# Patient Record
Sex: Male | Born: 1955
Health system: Southern US, Community
[De-identification: ages and names within clinical notes are randomized; demographics above are authoritative.]

## PROBLEM LIST (undated history)

## (undated) DIAGNOSIS — R519 Headache, unspecified: Secondary | ICD-10-CM

## (undated) DIAGNOSIS — I10 Essential (primary) hypertension: Secondary | ICD-10-CM

## (undated) DIAGNOSIS — R590 Localized enlarged lymph nodes: Secondary | ICD-10-CM

## (undated) DIAGNOSIS — C8590 Non-Hodgkin lymphoma, unspecified, unspecified site: Secondary | ICD-10-CM

## (undated) DIAGNOSIS — J45909 Unspecified asthma, uncomplicated: Secondary | ICD-10-CM

## (undated) DIAGNOSIS — R011 Cardiac murmur, unspecified: Secondary | ICD-10-CM

## (undated) DIAGNOSIS — G4700 Insomnia, unspecified: Secondary | ICD-10-CM

## (undated) DIAGNOSIS — K635 Polyp of colon: Secondary | ICD-10-CM

## (undated) DIAGNOSIS — C439 Malignant melanoma of skin, unspecified: Secondary | ICD-10-CM

## (undated) DIAGNOSIS — K5790 Diverticulosis of intestine, part unspecified, without perforation or abscess without bleeding: Secondary | ICD-10-CM

## (undated) HISTORY — DX: Malignant melanoma of skin, unspecified: C43.9

## (undated) HISTORY — DX: Polyp of colon: K63.5

## (undated) HISTORY — PX: KNEE ARTHROSCOPY: SHX127

## (undated) HISTORY — PX: COLONOSCOPY: SHX174

## (undated) HISTORY — DX: Diverticulosis of intestine, part unspecified, without perforation or abscess without bleeding: K57.90

## (undated) HISTORY — DX: Insomnia, unspecified: G47.00

## (undated) HISTORY — PX: POLYPECTOMY: SHX149

## (undated) HISTORY — DX: Non-Hodgkin lymphoma, unspecified, unspecified site: C85.90

## (undated) HISTORY — DX: Unspecified asthma, uncomplicated: J45.909

## (undated) HISTORY — DX: Headache, unspecified: R51.9

## (undated) NOTE — *Deleted (*Deleted)
HEMATOLOGY/ONCOLOGY CONSULTATION NOTE  Date of Service: 12/05/2019  Patient Care Team: Geoffry Paradise, MD as PCP - General (Internal Medicine)  CHIEF COMPLAINTS/PURPOSE OF CONSULTATION:  Lymphoma  HISTORY OF PRESENTING ILLNESS:  Mike Jenkins is a wonderful 36 y.o. male who has been referred to [place] by Dr. Geoffry Paradise for evaluation and management of lymphoma. The pt reports that he is doing well overall.   The pt reports that he developed rt tonsillar swelling a few months ago which did not respond to antibiotics and he was sent to ENT. He had a CT neck which showed- CT Soft Tissue Neck ([id number]) completed on 10/06/2019 with results revealing "Asymmetric fullness of the right tonsil. Although no masslike enhancement or submucosal tumor, recommend continued workup/biopsy given there is also a right level 2 lymphadenopathy."   Patient noted a lump in the rt groin and had an MRI Abd/Pel ([id number]) ([id number]) completed on 09/22/2019 with results revealing "1. A right inguinal lymph node is pathologically enlarged with short axis diameter of 1.8 cm and diffuse enhancement. Possibilities may include reactive adenopathy or malignant adenopathy, for example in the setting of lymphoma. Tenderness associated with such lymph nodes is often considered in it indicator of a benign reactive etiology although this is not absolute, and if the lymph node fails to resolve spontaneously, biopsy may be warranted. 2. Chronic stranding in the central upper abdominal mesentery with scattered small mesenteric lymph nodes. This is unchanged from 2015 and has a pattern favoring chronic sclerosing mesenteritis although a similar pattern can occasionally be encountered in the setting of lymphoma. 3. Small right adrenal adenoma. 4. Bosniak category IIF cystic lesion of the left kidney lower pole. This complex cyst has some internal septations and is probably benign but warrants surveillance. 5. Benign  prostatic hypertrophy. 6. Suspected small gallstones. 7.  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  On review of systems, pt reports no fevers/chi//snight sweat or significant weight loss .  INTERVAL HISTORY: Mike Jenkins is a wonderful 59 y.o. male who is here for evaluation and management of lymphoma. The patient's last visit with [place] was on 11/17/2019. The pt reports that he is doing well overall.  The pt reports ***  Of note since the patient's last visit, pt has had Bone Marrow Report ([id number]) completed on 11/26/2019 with results revealing "BONE MARROW, ASPIRATE, CLOT, CORE: - Normocellular marrow with trilineage hematopoiesis - No morphologic or immunophenotypic evidence of lymphoma PERIPHERAL BLOOD: - Mild normocytic anemia."  Lab results today (12/05/19) of CBC w/diff and CMP is as follows: all values are WNL except for ***.  On review of systems, pt reports *** and denies ***and any other symptoms.   A&P: -Discussed pt labwork today, 12/05/19; *** -Discussed 11/17/2019 Hep C & Hep B testing is negative, LDH is WNL -***  MEDICAL HISTORY:  Past Medical History:  Diagnosis Date  . Asthma   . Colon polyps    hyperplastic  . Diverticulosis   . Heart murmur    since he was a baby-echo done preop hip surg 2003-  . Hypertension   . Lymphadenopathy, inguinal    right  . Melanoma (HCC)  SURGICAL HISTORY: Past Surgical History:  Procedure Laterality Date  . COLONOSCOPY    . I & D EXTREMITY  12/26/2011   Procedure: IRRIGATION AND DEBRIDEMENT EXTREMITY;  Surgeon: Tami Ribas, MD;  Location: [facility];  Service: Orthopedics;  Laterality: Left;  . KNEE ARTHROSCOPY Bilateral 1995, 1998   right and left  . MASS EXCISION  12/26/2011   Procedure: EXCISION MASS;  Surgeon: Tami Ribas, MD;  Location: [facility];  Service: Orthopedics;  Laterality: Left;  LEFT SMALL EXCISION MASS & DEBRIDEMENT DIP JOINT  . MELANOMA EXCISION Left 2013  . POLYPECTOMY    . tonsillar cauterization  11/09/2019   Tonsillar Cauterization (N/A Throat)  . TONSILLECTOMY Right 11/04/2019   Procedure: TONSILLECTOMY;  Surgeon: Osborn Coho, MD;  Location: [facility];  Service: ENT;  Laterality: Right;  . TONSILLECTOMY N/A 11/09/2019   Procedure: Tonsillar Cauterization;  Surgeon: Osborn Coho, MD;  Location: [facility];  Service: ENT;  Laterality: N/A;  . TOTAL HIP ARTHROPLASTY Left 2003    SOCIAL HISTORY: Social History   Socioeconomic History  . Marital status: Married    Spouse name: Not on file  . Number of children: 3  . Years of education: Not on file  . Highest education level: Not on file  Occupational History  . Occupation: Data processing manager: [employer]  Tobacco Use  . Smoking status: Former Games developer  . Smokeless tobacco: Never Used  Vaping Use  . Vaping Use: Never used  Substance and Sexual Activity  . Alcohol use: Yes    Comment: 2-3 drinks per week  . Drug use: No  . Sexual activity: Not on file  Other Topics Concern  . Not on file  Social History Narrative  . Not on file   Social Determinants of Health   Financial Resource Strain:   . Difficulty of Paying Living Expenses: Not on file  Food Insecurity:   . Worried About Programme researcher, broadcasting/film/video in the Last Year: Not on file  . Ran Out of Food in the Last Year: Not on file  Transportation Needs:   . Lack of Transportation (Medical): Not on file  . Lack of Transportation (Non-Medical): Not on file  Physical Activity:   . Days of Exercise per Week: Not on file  . Minutes of Exercise per  Session: Not on file  Stress:   . Feeling of Stress : Not on file  Social Connections:   . Frequency of Communication with Friends and Family: Not on file  . Frequency of Social Gatherings with Friends and Family: Not on file  . Attends Religious Services: Not on file  . Active Member of Clubs or Organizations: Not on file  . Attends Banker Meetings: Not on file  . Marital Status: Not on file  Intimate Partner Violence:   . Fear of Current or Ex-Partner: Not on file  . Emotionally Abused: Not on file  . Physically Abused: Not on file  . Sexually Abused: Not on file    FAMILY HISTORY: Family History  Problem Relation Age of Onset  . Colon polyps Father   . Colon cancer Neg Hx   . Esophageal cancer Neg Hx   . Rectal cancer Neg Hx   . Stomach cancer Neg Hx     ALLERGIES:  has No Known Allergies.  MEDICATIONS:  Current Outpatient Medications  Medication Sig Dispense Refill  . LORazepam (ATIVAN) 1 MG tablet Take  1 tablet by mouth at bedtime as needed.    Marland Kitchen losartan (COZAAR) 50 MG tablet Take 50 mg by mouth daily. Taking 2 tablets daily for total of 100 mg.     No current facility-administered medications for this visit.    REVIEW OF SYSTEMS:   A 10+ POINT REVIEW OF SYSTEMS WAS OBTAINED including neurology, dermatology, psychiatry, cardiac, respiratory, lymph, extremities, GI, GU, Musculoskeletal, constitutional, breasts, reproductive, HEENT.  All pertinent positives are noted in the HPI.  All others are negative.   PHYSICAL EXAMINATION: ECOG PERFORMANCE STATUS: 1 - Symptomatic but completely ambulatory  . There were no vitals filed for this visit. There were no vitals filed for this visit. .There is no height or weight on file to calculate BMI.  *** GENERAL:alert, in no acute distress and comfortable SKIN: no acute rashes, no significant lesions EYES: conjunctiva are pink and non-injected, sclera anicteric OROPHARYNX: MMM, no exudates, no oropharyngeal  erythema or ulceration NECK: supple, no JVD LYMPH:  no palpable lymphadenopathy in the cervical, axillary or inguinal regions LUNGS: clear to auscultation b/l with normal respiratory effort HEART: regular rate & rhythm ABDOMEN:  normoactive bowel sounds , non tender, not distended. No palpable hepatosplenomegaly.  Extremity: no pedal edema PSYCH: alert & oriented x 3 with fluent speech NEURO: no focal motor/sensory deficits  LABORATORY DATA:  I have reviewed the data as listed  . CBC Latest Ref Rng & Units 11/26/2019 11/17/2019 11/09/2019  WBC 4.0 - 10.5 K/uL 6.0 6.9 10.9(H)  Hemoglobin 13.0 - 17.0 g/dL 12.1(L) 10.7(L) 11.1(L)  Hematocrit 39 - 52 % 36.2(L) 31.5(L) 33.4(L)  Platelets 150 - 400 K/uL 209 235 170    . CMP Latest Ref Rng & Units 11/17/2019 11/09/2019 11/08/2019  Glucose 70 - 99 mg/dL 161(W) 960(A) 540(J)  BUN 8 - 23 mg/dL 13 81(X) 13  Creatinine 0.61 - 1.24 mg/dL 9.14 7.82 9.56  Sodium 135 - 145 mmol/L 142 136 139  Potassium 3.5 - 5.1 mmol/L 3.9 4.2 4.1  Chloride 98 - 111 mmol/L 106 100 102  CO2 22 - 32 mmol/L 29 - 27  Calcium 8.9 - 10.3 mg/dL 9.7 - 9.5  Total Protein 6.5 - 8.1 g/dL 7.1 - 6.8  Total Bilirubin 0.3 - 1.2 mg/dL 0.5 - 0.9  Alkaline Phos 38 - 126 U/L 54 - 51  AST 15 - 41 U/L 13(L) - 31  ALT 0 - 44 U/L 17 - 28   11/04/2019 Right Tonsillectomy Surgical Pathology Report [id number]):  Surgical Pathology Report Clinical History: None provided FINAL MICROSCOPIC DIAGNOSIS: A. TONSIL, RIGHT, TONSILLECTOMY: -Non-Hodgkin B-cell lymphoma -See comment COMMENT: The sections show effacement of the tonsillar tissue by a dense lymphoid proliferation characterized by numerous ill-defined atypical lymphoid follicles displaying attenuated or absent mantle zones, lack of polarity and a homogenous composition of lymphocytes. The follicles appear to coalesce in many areas forming a diffuse pattern of involvement. In both the follicular and diffuse areas, a similar  composition of lymphocytes is generally seen characterized by predominance of small round to angulated lymphocytes with high nuclear cytoplasmic ratio, dense chromatin and small to inconspicuous nucleoli admixed with larger centrocytes and centroblastic lymphoid cells to a lesser extent. The number of the latter varies but appears to average slightly less than 15 cells/HPF. Scattered residual squamous epithelial clusters are seen. A portion of the tonsil appears relatively uninvolved. Flow cytometric analysis was performed [facility] [phone]) but was essentially non-contributory. A battery of immunohistochemical stains was performed with appropriate controls and show that there  is a significant B-cell component in the follicular and diffuse components as seen with B-cell markers CD20 and CD79a associated with CD10, BCL6 and BCL2 expression. This is admixed with relative abundance of T cells as seen with CD3, CD5 and CD43. There is no apparent co-expression of CD5 in B-cell areas and no significant cyclin D1 positivity is identified. CD21 highlights dendritic networks in previously described follicles. CD138 highlights a relatively minor plasma cell component which shows polyclonal staining pattern for kappa and lambda light chains. The overall features are atypical and most consistent with involvement by non-Hodgkin B-cell lymphoma, follicular type with follicular and diffuse pattern. While grading of this process is somewhat challenging and the appearance is borderline, a low-grade process is slightly favored at this time (grade 2/3).   RADIOGRAPHIC STUDIES: I have personally reviewed the radiological images as listed and agreed with the findings in the report. 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 EXAM: CT GUIDED BONE MARROW ASPIRATION AND BIOPSY ANESTHESIA/SEDATION: Versed 4.0 mg IV, Fentanyl 100  mcg IV Total Moderate Sedation Time:   13 minutes. The patient's level of consciousness and physiologic status were continuously monitored during the procedure by Radiology nursing. PROCEDURE: The procedure risks, benefits, and alternatives were explained to the patient. Questions regarding the procedure were encouraged and answered. The patient understands and consents to the procedure. A time out was performed prior to initiating the procedure. The right gluteal region was prepped with chlorhexidine. Sterile gown and sterile gloves were used for the procedure. Local anesthesia was provided with 1% Lidocaine. Under CT guidance, an 11 gauge On Control bone cutting needle was advanced from a posterior approach into the right iliac bone. Needle positioning was confirmed with CT. Initial non heparinized and heparinized aspirate samples were obtained of bone marrow. Core biopsy was performed via the On Control drill needle. COMPLICATIONS: None FINDINGS: Inspection of initial aspirate did reveal visible particles. Intact core biopsy sample was obtained. IMPRESSION: CT guided bone marrow biopsy of right posterior iliac bone with both aspirate and core samples obtained. Electronically Signed   By: Irish Lack M.D.   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 EXAM: CT GUIDED BONE MARROW ASPIRATION AND BIOPSY ANESTHESIA/SEDATION: Versed 4.0 mg IV, Fentanyl 100 mcg IV Total Moderate Sedation Time:   13 minutes. The patient's level of consciousness and physiologic status were continuously monitored during the procedure by Radiology nursing. PROCEDURE: The procedure risks, benefits, and alternatives were explained to the patient. Questions regarding the procedure were encouraged and answered. The patient understands and consents to the procedure. A time out was performed prior  to initiating the procedure. The right gluteal region was prepped with chlorhexidine. Sterile gown and sterile gloves were used for the procedure. Local anesthesia was provided with 1% Lidocaine. Under CT guidance, an 11 gauge On Control bone cutting needle was advanced from a posterior approach into the right iliac bone. Needle positioning was confirmed with CT. Initial non heparinized and heparinized aspirate samples were obtained of bone marrow. Core biopsy was performed via the On Control drill needle. COMPLICATIONS: None FINDINGS: Inspection of initial aspirate did reveal visible particles. Intact core biopsy sample was obtained. IMPRESSION: CT guided bone marrow biopsy of right posterior iliac bone with both aspirate and core samples obtained. Electronically  Signed   By: Irish Lack M.D.   On: 11/26/2019 10:26     CT neck 8/26: IMPRESSION: Asymmetric fullness of the right tonsil. Although no masslike enhancement or submucosal tumor, recommend continued workup/biopsy given there is also a right level 2 lymphadenopathy.   Electronically Signed   By: Marnee Spring M.D.   On: 10/07/2019 07:28  MRI bad/pelvis IMPRESSION: 1. A right inguinal lymph node is pathologically enlarged with short axis diameter of 1.8 cm and diffuse enhancement. Possibilities may include reactive adenopathy or malignant adenopathy, for example in the setting of lymphoma. Tenderness associated with such lymph nodes is often considered in it indicator of a benign reactive etiology although this is not absolute, and if the lymph node fails to resolve spontaneously, biopsy may be warranted. 2. Chronic stranding in the central upper abdominal mesentery with scattered small mesenteric lymph nodes. This is unchanged from 2015 and has a pattern favoring chronic sclerosing mesenteritis although a similar pattern can occasionally be encountered in the setting of lymphoma. 3. Small right adrenal adenoma. 4. Bosniak  category IIF cystic lesion of the left kidney lower pole. This complex cyst has some internal septations and is probably benign but warrants surveillance. Follow up renal protocol MRI is recommended in 6 months time. This recommendation follows ACR consensus guidelines: Management of the Incidental Renal Mass on CT: A White Paper of the ACR Incidental Findings Committee. J Am Coll Radiol [phone]. 5. Benign prostatic hypertrophy. 6. Suspected small gallstones. 7.  Aortic Atherosclerosis (ICD10-I70.0).   Electronically Signed   By: Gaylyn Rong M.D.   On: 09/22/2019 17:02  ASSESSMENT & PLAN:   13 yo with   1) Newly diagnosed at least stage III Follicular likely low grade but grading difficult to determine from tonsillar sample 2) Inguiinal LNadenopathy- likely related to lymphoma  PLAN: ***   FOLLOW UP: ***   The total time spent in the appt was *** minutes and more than 50% was on counseling and direct patient cares.  All of the patient's questions were answered with apparent satisfaction. The patient knows to call the clinic with any problems, questions or concerns.    Wyvonnia Lora MD MS AAHIVMS [facility] Hematology/Oncology Physician [facility]  (Office):       [phone] (Work cell):  [phone] (Fax):           [phone]  12/05/2019 8:50 PM  I, Carollee Herter, am acting as a scribe for Dr. Wyvonnia Lora.   {Add Production assistant, radio Statement}

---

## 2001-02-11 HISTORY — PX: TOTAL HIP ARTHROPLASTY: SHX124

## 2002-09-03 ENCOUNTER — Emergency Department (HOSPITAL_COMMUNITY): Admission: EM | Admit: 2002-09-03 | Discharge: 2002-09-03 | Payer: Self-pay | Admitting: Emergency Medicine

## 2002-09-03 ENCOUNTER — Encounter: Payer: Self-pay | Admitting: Emergency Medicine

## 2007-01-21 ENCOUNTER — Ambulatory Visit: Payer: Self-pay | Admitting: Internal Medicine

## 2007-03-09 ENCOUNTER — Ambulatory Visit: Payer: Self-pay | Admitting: Internal Medicine

## 2007-03-09 ENCOUNTER — Encounter: Payer: Self-pay | Admitting: Internal Medicine

## 2008-10-15 ENCOUNTER — Emergency Department (HOSPITAL_COMMUNITY): Admission: EM | Admit: 2008-10-15 | Discharge: 2008-10-15 | Payer: Self-pay | Admitting: Emergency Medicine

## 2010-09-10 ENCOUNTER — Other Ambulatory Visit: Payer: Self-pay | Admitting: Family Medicine

## 2010-09-10 ENCOUNTER — Ambulatory Visit
Admission: RE | Admit: 2010-09-10 | Discharge: 2010-09-10 | Disposition: A | Discharge status: Home / Self Care | Payer: BC Managed Care – PPO | Source: Ambulatory Visit | Attending: Family Medicine | Admitting: Family Medicine

## 2010-09-10 DIAGNOSIS — S301XXA Contusion of abdominal wall, initial encounter: Secondary | ICD-10-CM

## 2010-09-10 DIAGNOSIS — R52 Pain, unspecified: Secondary | ICD-10-CM

## 2010-09-10 MED ORDER — IOHEXOL 300 MG/ML  SOLN
100.0000 mL | Freq: Once | INTRAMUSCULAR | Status: AC | PRN
  Administered 2010-09-10: 100 mL via INTRAVENOUS

## 2010-09-28 ENCOUNTER — Other Ambulatory Visit (HOSPITAL_COMMUNITY): Payer: Self-pay | Admitting: Internal Medicine

## 2010-09-28 DIAGNOSIS — S301XXA Contusion of abdominal wall, initial encounter: Secondary | ICD-10-CM

## 2010-10-02 ENCOUNTER — Inpatient Hospital Stay (HOSPITAL_COMMUNITY): Admission: RE | Admit: 2010-10-02 | Payer: BC Managed Care – PPO | Source: Ambulatory Visit

## 2010-10-02 ENCOUNTER — Encounter (HOSPITAL_COMMUNITY): Payer: BC Managed Care – PPO

## 2010-10-08 ENCOUNTER — Encounter (HOSPITAL_COMMUNITY): Payer: BC Managed Care – PPO

## 2010-10-08 ENCOUNTER — Ambulatory Visit (HOSPITAL_COMMUNITY): Payer: BC Managed Care – PPO

## 2010-10-13 ENCOUNTER — Inpatient Hospital Stay (INDEPENDENT_AMBULATORY_CARE_PROVIDER_SITE_OTHER)
Admission: RE | Admit: 2010-10-13 | Discharge: 2010-10-13 | Disposition: A | Discharge status: Home / Self Care | Payer: BC Managed Care – PPO | Source: Ambulatory Visit | Attending: Emergency Medicine | Admitting: Emergency Medicine

## 2010-10-13 DIAGNOSIS — B86 Scabies: Secondary | ICD-10-CM

## 2011-02-12 HISTORY — PX: MELANOMA EXCISION: SHX5266

## 2011-11-11 ENCOUNTER — Emergency Department (INDEPENDENT_AMBULATORY_CARE_PROVIDER_SITE_OTHER)
Admission: EM | Admit: 2011-11-11 | Discharge: 2011-11-11 | Disposition: A | Discharge status: Home / Self Care | Payer: BC Managed Care – PPO | Source: Home / Self Care

## 2011-11-11 ENCOUNTER — Encounter (HOSPITAL_COMMUNITY): Payer: Self-pay

## 2011-11-11 DIAGNOSIS — L03012 Cellulitis of left finger: Secondary | ICD-10-CM

## 2011-11-11 DIAGNOSIS — L02519 Cutaneous abscess of unspecified hand: Secondary | ICD-10-CM

## 2011-11-11 MED ORDER — CEPHALEXIN 500 MG PO CAPS: 500.0000 mg | ORAL_CAPSULE | Freq: Four times a day (QID) | ORAL | Status: DC

## 2011-11-11 NOTE — ED Notes (Signed)
Patient complains of left "pinky" finger soreness, states has been there 2 weeks, states his PCP Jacky Kindle prescribed a antibiotic for him but he misread the instructions and has only taken it once a day vs 4x daily, patient does not know the name of antibiotic prescribed

## 2011-11-11 NOTE — ED Provider Notes (Signed)
History     CSN: [id number]  Arrival date & time 11/11/11  0915   None     Chief Complaint  Patient presents with  . Finger Injury    (Consider location/radiation/quality/duration/timing/severity/associated sxs/prior treatment) HPI Comments: 56-year-old male who states he had an infection on his left hand fifth digit for approximately one month. Isn't certain of the nature of the injury. There was a mounded area of swelling on the dorsal aspect of the distal phalanx. He had popped it and it had drained some pus over the past 2-3 weeks. He called his doctor and prescription antibiotic was called and all the physician did not evaluate it. He describes With Sounds like Keflex. We Got the Prescription Filled He Only Took One a Day Not Reading the Instructions of 4 Times a Day. Since There Was 4 Times a Day about 5 Days Ago He Is Still Only Taken about 2 Days Worth of Antibiotics 4 Times a Day the Other Days Have Been Quite Sporadic and self dosing. He complains of mild discomfort where the small raised lesion is and is concerned there may be a foreign body but is not certain.   History reviewed. No pertinent past medical history.  Past Surgical History  Procedure Date  . Total hip arthroplasty LEFT    No family history on file.  History  Substance Use Topics  . Smoking status: Never Smoker   . Smokeless tobacco: Not on file  . Alcohol Use: Yes      Review of Systems  Constitutional: Negative.   Respiratory: Negative.   Gastrointestinal: Negative.   Genitourinary: Negative.   Musculoskeletal: Negative for joint swelling and arthralgias.       As per HPI  Skin: Negative.   Neurological: Negative for dizziness, weakness, numbness and headaches.    Allergies  Review of patient's allergies indicates no known allergies.  Home Medications   Current Outpatient Rx  Name Route Sig Dispense Refill  . ALTACE PO Oral Take by mouth daily.    . CEPHALEXIN 500 MG PO CAPS Oral Take  1 capsule (500 mg total) by mouth 4 (four) times daily. 20 capsule 0    BP 115/65  Pulse 71  Temp 98.2 F (36.8 C) (Oral)  Resp 16  SpO2 98%  Physical Exam  Constitutional: He is oriented to person, place, and time. He appears well-developed and well-nourished.  HENT:  Head: Normocephalic and atraumatic.  Eyes: EOM are normal. Left eye exhibits no discharge.  Neck: Normal range of motion. Neck supple.  Neurological: He is alert and oriented to person, place, and time. No cranial nerve deficit.  Skin: Skin is warm and dry.       The lesion on the left fifth finger is located on the extensor surface of the distal phalanx. He has a annular slightly raised lesion with mild erythema. The erythema does not extend beyond the lesion. He does complain of soreness in the DI P. no deformity or swelling.  Psychiatric: He has a normal mood and affect.    ED Course  Procedures (including critical care time)  Labs Reviewed - No data to display No results found.   1. Cellulitis of fifth finger of left hand       MDM  Keflex 500 4 times a day for the next 5 days. This will give him enough for another 8 days of treatment at a 4 times a day schedule. He is to soak his right little finger in warm  saltwater 3-4 times a day if possible. If in 2-3 days he is not seeing improvement he is to call his physician to get a referral to a hand surgeon. He initially stated that he wanted to give the antibiotics a chance to work. We discussed the potential to develop tenosynovitis as well as the possibility of a foreign body which would require the services of a hand surgeon. At this point the evaluation does not require a hand surgeon as long as antibiotics are resolving the lesion. But if this is not the case he will need to see a hand surgeon soon. He knows not to wait if there is any worsening. Is also advised that if he is unable to have his physician make a referral to a hand surgeon if not improving and he  may call back to the urgent care and we can make a referral for him.        Hayden Rasmussen, NP 11/11/11 1135  Hayden Rasmussen, NP 11/11/11 1136

## 2011-11-13 NOTE — ED Provider Notes (Signed)
Medical screening examination/treatment/procedure(s) were performed by non-physician practitioner and as supervising physician I was immediately available for consultation/collaboration.  Leslee Home, M.D.   Reuben Likes, MD 11/13/11 [phone]

## 2011-12-06 ENCOUNTER — Encounter (HOSPITAL_BASED_OUTPATIENT_CLINIC_OR_DEPARTMENT_OTHER): Payer: Self-pay | Admitting: *Deleted

## 2011-12-06 ENCOUNTER — Other Ambulatory Visit: Payer: Self-pay | Admitting: Orthopedic Surgery

## 2011-12-06 NOTE — Progress Notes (Signed)
Will need istat and ekg 

## 2011-12-09 ENCOUNTER — Encounter (HOSPITAL_BASED_OUTPATIENT_CLINIC_OR_DEPARTMENT_OTHER): Payer: Self-pay | Admitting: Anesthesiology

## 2011-12-09 ENCOUNTER — Ambulatory Visit (HOSPITAL_BASED_OUTPATIENT_CLINIC_OR_DEPARTMENT_OTHER)
Admission: RE | Admit: 2011-12-09 | Payer: BC Managed Care – PPO | Source: Ambulatory Visit | Admitting: Orthopedic Surgery

## 2011-12-09 ENCOUNTER — Encounter (HOSPITAL_BASED_OUTPATIENT_CLINIC_OR_DEPARTMENT_OTHER): Admission: RE | Payer: Self-pay | Source: Ambulatory Visit

## 2011-12-09 HISTORY — DX: Cardiac murmur, unspecified: R01.1

## 2011-12-09 HISTORY — DX: Essential (primary) hypertension: I10

## 2011-12-09 SURGERY — EXCISION MASS
Anesthesia: Choice | Site: Finger | Laterality: Left

## 2011-12-25 ENCOUNTER — Other Ambulatory Visit: Payer: Self-pay

## 2011-12-25 ENCOUNTER — Encounter (HOSPITAL_BASED_OUTPATIENT_CLINIC_OR_DEPARTMENT_OTHER)
Admission: RE | Admit: 2011-12-25 | Discharge: 2011-12-25 | Disposition: A | Discharge status: Home / Self Care | Payer: BC Managed Care – PPO | Source: Ambulatory Visit | Attending: Orthopedic Surgery | Admitting: Orthopedic Surgery

## 2011-12-25 LAB — BASIC METABOLIC PANEL
BUN: 17 mg/dL (ref 6–23)
CO2: 28 mEq/L (ref 19–32)
Calcium: 9.4 mg/dL (ref 8.4–10.5)
GFR calc non Af Amer: 82 mL/min — ABNORMAL LOW (ref >90)
Glucose, Bld: 117 mg/dL — ABNORMAL HIGH (ref 70–99)

## 2011-12-26 ENCOUNTER — Encounter (HOSPITAL_BASED_OUTPATIENT_CLINIC_OR_DEPARTMENT_OTHER): Payer: Self-pay | Admitting: Anesthesiology

## 2011-12-26 ENCOUNTER — Encounter (HOSPITAL_BASED_OUTPATIENT_CLINIC_OR_DEPARTMENT_OTHER)
Admission: RE | Disposition: A | Discharge status: Home / Self Care | Payer: Self-pay | Source: Ambulatory Visit | Attending: Orthopedic Surgery

## 2011-12-26 ENCOUNTER — Ambulatory Visit (HOSPITAL_BASED_OUTPATIENT_CLINIC_OR_DEPARTMENT_OTHER)
Admission: RE | Admit: 2011-12-26 | Discharge: 2011-12-26 | Disposition: A | Discharge status: Home / Self Care | Payer: BC Managed Care – PPO | Source: Ambulatory Visit | Attending: Orthopedic Surgery | Admitting: Orthopedic Surgery

## 2011-12-26 ENCOUNTER — Ambulatory Visit (HOSPITAL_BASED_OUTPATIENT_CLINIC_OR_DEPARTMENT_OTHER): Payer: BC Managed Care – PPO | Admitting: Anesthesiology

## 2011-12-26 ENCOUNTER — Encounter (HOSPITAL_BASED_OUTPATIENT_CLINIC_OR_DEPARTMENT_OTHER): Payer: Self-pay | Admitting: *Deleted

## 2011-12-26 DIAGNOSIS — M898X9 Other specified disorders of bone, unspecified site: Secondary | ICD-10-CM | POA: Insufficient documentation

## 2011-12-26 DIAGNOSIS — I10 Essential (primary) hypertension: Secondary | ICD-10-CM | POA: Insufficient documentation

## 2011-12-26 DIAGNOSIS — Z96649 Presence of unspecified artificial hip joint: Secondary | ICD-10-CM | POA: Insufficient documentation

## 2011-12-26 DIAGNOSIS — L723 Sebaceous cyst: Secondary | ICD-10-CM | POA: Insufficient documentation

## 2011-12-26 DIAGNOSIS — M19049 Primary osteoarthritis, unspecified hand: Secondary | ICD-10-CM | POA: Insufficient documentation

## 2011-12-26 HISTORY — PX: MASS EXCISION: SHX2000

## 2011-12-26 HISTORY — PX: I & D EXTREMITY: SHX5045

## 2011-12-26 SURGERY — EXCISION MASS
Anesthesia: Regional | Site: Finger | Laterality: Left | Wound class: Clean

## 2011-12-26 MED ORDER — MEPERIDINE HCL 25 MG/ML IJ SOLN: 6.2500 mg | INTRAMUSCULAR | Status: DC | PRN

## 2011-12-26 MED ORDER — OXYCODONE HCL 5 MG PO TABS: 5.0000 mg | ORAL_TABLET | Freq: Once | ORAL | Status: DC | PRN

## 2011-12-26 MED ORDER — HYDROCODONE-ACETAMINOPHEN 5-325 MG PO TABS: ORAL_TABLET | ORAL | Status: DC

## 2011-12-26 MED ORDER — BUPIVACAINE HCL (PF) 0.25 % IJ SOLN
INTRAMUSCULAR | Status: DC | PRN
  Administered 2011-12-26: 10 mL

## 2011-12-26 MED ORDER — LIDOCAINE HCL (PF) 0.5 % IJ SOLN
INTRAMUSCULAR | Status: DC | PRN
  Administered 2011-12-26: 30 mL via INTRAVENOUS

## 2011-12-26 MED ORDER — OXYCODONE HCL 5 MG/5ML PO SOLN: 5.0000 mg | Freq: Once | ORAL | Status: DC | PRN

## 2011-12-26 MED ORDER — LACTATED RINGERS IV SOLN
INTRAVENOUS | Status: DC
  Administered 2011-12-26: 10:00:00 via INTRAVENOUS

## 2011-12-26 MED ORDER — CHLORHEXIDINE GLUCONATE 4 % EX LIQD: 60.0000 mL | Freq: Once | CUTANEOUS | Status: DC

## 2011-12-26 MED ORDER — FENTANYL CITRATE 0.05 MG/ML IJ SOLN: 25.0000 ug | INTRAMUSCULAR | Status: DC | PRN

## 2011-12-26 MED ORDER — CEFAZOLIN SODIUM-DEXTROSE 2-3 GM-% IV SOLR
2.0000 g | Freq: Once | INTRAVENOUS | Status: AC
  Administered 2011-12-26: 2 g via INTRAVENOUS

## 2011-12-26 SURGICAL SUPPLY — 61 items
BAG DECANTER FOR FLEXI CONT (MISCELLANEOUS) IMPLANT
BANDAGE COBAN STERILE 2 (GAUZE/BANDAGES/DRESSINGS) ×2 IMPLANT
BANDAGE CONFORM 2  STR LF (GAUZE/BANDAGES/DRESSINGS) IMPLANT
BANDAGE ELASTIC 3 VELCRO ST LF (GAUZE/BANDAGES/DRESSINGS) IMPLANT
BANDAGE GAUZE ELAST BULKY 4 IN (GAUZE/BANDAGES/DRESSINGS) IMPLANT
BANDAGE GAUZE STRT 1 STR LF (GAUZE/BANDAGES/DRESSINGS) IMPLANT
BENZOIN TINCTURE PRP APPL 2/3 (GAUZE/BANDAGES/DRESSINGS) IMPLANT
BLADE MINI RND TIP GREEN BEAV (BLADE) IMPLANT
BLADE SURG 15 STRL LF DISP TIS (BLADE) ×2 IMPLANT
BLADE SURG 15 STRL SS (BLADE) ×2
BNDG COHESIVE 1X5 TAN STRL LF (GAUZE/BANDAGES/DRESSINGS) ×2 IMPLANT
BNDG ELASTIC 2 VLCR STRL LF (GAUZE/BANDAGES/DRESSINGS) IMPLANT
BNDG ESMARK 4X9 LF (GAUZE/BANDAGES/DRESSINGS) ×2 IMPLANT
BNDG PLASTER X FAST 3X3 WHT LF (CAST SUPPLIES) IMPLANT
CHLORAPREP W/TINT 26ML (MISCELLANEOUS) ×2 IMPLANT
CLOTH BEACON ORANGE TIMEOUT ST (SAFETY) ×2 IMPLANT
CORDS BIPOLAR (ELECTRODE) ×2 IMPLANT
COVER MAYO STAND STRL (DRAPES) ×2 IMPLANT
COVER TABLE BACK 60X90 (DRAPES) ×2 IMPLANT
CUFF TOURNIQUET SINGLE 18IN (TOURNIQUET CUFF) ×2 IMPLANT
DRAPE EXTREMITY T 121X128X90 (DRAPE) ×2 IMPLANT
DRAPE SURG 17X23 STRL (DRAPES) ×2 IMPLANT
GAUZE PACKING IODOFORM 1/4X5 (PACKING) IMPLANT
GAUZE SPONGE 4X4 12PLY STRL LF (GAUZE/BANDAGES/DRESSINGS) ×2 IMPLANT
GAUZE XEROFORM 1X8 LF (GAUZE/BANDAGES/DRESSINGS) ×2 IMPLANT
GLOVE BIO SURGEON STRL SZ7.5 (GLOVE) ×2 IMPLANT
GLOVE BIOGEL M 7.0 STRL (GLOVE) ×2 IMPLANT
GLOVE BIOGEL PI IND STRL 7.5 (GLOVE) ×1 IMPLANT
GLOVE BIOGEL PI IND STRL 8 (GLOVE) ×1 IMPLANT
GLOVE BIOGEL PI INDICATOR 7.5 (GLOVE) ×1
GLOVE BIOGEL PI INDICATOR 8 (GLOVE) ×1
GLOVE EXAM NITRILE MD LF STRL (GLOVE) ×2 IMPLANT
GOWN PREVENTION PLUS XLARGE (GOWN DISPOSABLE) ×2 IMPLANT
GOWN STRL REIN XL XLG (GOWN DISPOSABLE) ×2 IMPLANT
LOOP VESSEL MAXI BLUE (MISCELLANEOUS) IMPLANT
NEEDLE HYPO 25X1 1.5 SAFETY (NEEDLE) ×2 IMPLANT
NS IRRIG 1000ML POUR BTL (IV SOLUTION) ×2 IMPLANT
PACK BASIN DAY SURGERY FS (CUSTOM PROCEDURE TRAY) ×2 IMPLANT
PAD CAST 3X4 CTTN HI CHSV (CAST SUPPLIES) IMPLANT
PAD CAST 4YDX4 CTTN HI CHSV (CAST SUPPLIES) IMPLANT
PADDING CAST ABS 4INX4YD NS (CAST SUPPLIES) ×1
PADDING CAST ABS COTTON 4X4 ST (CAST SUPPLIES) ×1 IMPLANT
PADDING CAST COTTON 3X4 STRL (CAST SUPPLIES)
PADDING CAST COTTON 4X4 STRL (CAST SUPPLIES)
SPLINT FNGR PLAIN END 5/8X3.25 (CAST SUPPLIES) ×1 IMPLANT
SPLINT PLASTALUME 3 1/4 (CAST SUPPLIES) ×2
SPLINT PLASTER CAST XFAST 3X15 (CAST SUPPLIES) IMPLANT
SPLINT PLASTER XTRA FASTSET 3X (CAST SUPPLIES)
SPONGE GAUZE 4X4 12PLY (GAUZE/BANDAGES/DRESSINGS) ×2 IMPLANT
STOCKINETTE 4X48 STRL (DRAPES) ×2 IMPLANT
STRIP CLOSURE SKIN 1/2X4 (GAUZE/BANDAGES/DRESSINGS) IMPLANT
SUT ETHILON 3 0 PS 1 (SUTURE) IMPLANT
SUT ETHILON 4 0 PS 2 18 (SUTURE) ×2 IMPLANT
SWAB CULTURE LIQ STUART DBL (MISCELLANEOUS) IMPLANT
SYR BULB 3OZ (MISCELLANEOUS) ×2 IMPLANT
SYR CONTROL 10ML LL (SYRINGE) ×2 IMPLANT
TOWEL OR 17X24 6PK STRL BLUE (TOWEL DISPOSABLE) ×2 IMPLANT
TUBE ANAEROBIC SPECIMEN COL (MISCELLANEOUS) IMPLANT
TUBE FEEDING 5FR 15 INCH (TUBING) IMPLANT
UNDERPAD 30X30 INCONTINENT (UNDERPADS AND DIAPERS) IMPLANT
WATER STERILE IRR 1000ML POUR (IV SOLUTION) IMPLANT

## 2011-12-26 NOTE — Anesthesia Procedure Notes (Addendum)
Anesthesia Regional Block:  Bier block (IV Regional)  Pre-Anesthetic Checklist: ,, timeout performed, Correct Patient, Correct Site, Correct Laterality, Correct Procedure,, site marked, surgical consent,, at surgeon's request  Laterality: Left and Upper     Needles:  Injection technique: Single-shot  Needle Type: Other   (20 ga IV cath)    Needle Gauge: 22 and 22 G    Additional Needles: Bier block (IV Regional) Narrative:   Performed by: Personally   Bier block (IV Regional) Anesthesia Regional Block:   Narrative:

## 2011-12-26 NOTE — Anesthesia Preprocedure Evaluation (Signed)
Anesthesia Evaluation  Patient identified by MRN, date of birth, ID band Patient awake    History of Anesthesia Complications Negative for: history of anesthetic complications  Airway Mallampati: I  Neck ROM: Full    Dental  (+) Teeth Intact   Pulmonary  breath sounds clear to auscultation        Cardiovascular hypertension, Rhythm:Regular Rate:Normal     Neuro/Psych    GI/Hepatic Neg liver ROS,   Endo/Other  negative endocrine ROS  Renal/GU negative Renal ROS     Musculoskeletal negative musculoskeletal ROS (+)   Abdominal   Peds  Hematology negative hematology ROS (+)   Anesthesia Other Findings   Reproductive/Obstetrics                           Anesthesia Physical Anesthesia Plan  ASA: II  Anesthesia Plan: Bier Block   Post-op Pain Management:    Induction: Intravenous  Airway Management Planned: Simple Face Mask  Additional Equipment:   Intra-op Plan:   Post-operative Plan:   Informed Consent:   Plan Discussed with: CRNA and Surgeon  Anesthesia Plan Comments:         Anesthesia Quick Evaluation

## 2011-12-26 NOTE — H&P (Signed)
  Mike Jenkins is an 56 y.o. male.   Chief Complaint: left small finger mucoid cyst HPI: 56 yo rhd male with left small finger mass x 2 months.  He has popped it and drained thick bloody fluid.  No fevers, chills, night sweats.  Decreased ROM in small finger.  Past Medical History  Diagnosis Date  . Hypertension   . Heart murmur     since he was a baby-echo done preop hip surg 2003-    Past Surgical History  Procedure Date  . Total hip arthroplasty LEFT  . Joint replacement 2003    left total hip  . Knee arthroscopy     right and left  . Colonoscopy     History reviewed. No pertinent family history. Social History:  reports that he has never smoked. He does not have any smokeless tobacco history on file. He reports that he drinks alcohol. He reports that he does not use illicit drugs.  Allergies: No Known Allergies  Medications Prior to Admission  Medication Sig Dispense Refill  . Ramipril (ALTACE PO) Take by mouth daily.        Results for orders placed during the hospital encounter of 12/26/11 (from the past 48 hour(s))  BASIC METABOLIC PANEL     Status: Abnormal   Collection Time   12/25/11  2:20 PM      Component Value Range Comment   Sodium 140  135 - 145 mEq/L    Potassium 4.2  3.5 - 5.1 mEq/L    Chloride 101  96 - 112 mEq/L    CO2 28  19 - 32 mEq/L    Glucose, Bld 117 (*) 70 - 99 mg/dL    BUN 17  6 - 23 mg/dL    Creatinine, Ser 6.96  0.50 - 1.35 mg/dL    Calcium 9.4  8.4 - 29.5 mg/dL    GFR calc non Af Amer 82 (*) >90 mL/min    GFR calc Af Amer >90  >90 mL/min     No results found.   A comprehensive review of systems was negative.  Blood pressure 142/85, pulse 65, temperature 98.2 F (36.8 C), temperature source Oral, resp. rate 18, height 6\' 2"  (1.88 m), weight 106.142 kg (234 lb), SpO2 98.00%.  General appearance: alert, cooperative and appears stated age Head: Normocephalic, without obvious abnormality, atraumatic Neck: supple, symmetrical, trachea  midline Resp: clear to auscultation bilaterally Cardio: regular rate and rhythm GI: non tender Extremities: light touch sensation and capillary refill intact all digits.  +epl/fpl/io.  mass on dorsum of small finger distal to dip joint. Pulses: 2+ and symmetric Skin: Skin color, texture, turgor normal. No rashes or lesions Neurologic: Grossly normal Incision/Wound: na  Assessment/Plan Left small finger mucoid cyst and dip arthrosis.  Non operative and operative treatment options were discussed with the patient and patient wishes to proceed with operative treatment. Risks, benefits, and alternatives of surgery were discussed and the patient agrees with the plan of care.   Mike Jenkins 12/26/2011, 9:41 AM

## 2011-12-26 NOTE — Anesthesia Postprocedure Evaluation (Signed)
  Anesthesia Post-op Note  Patient: Mike Jenkins  Procedure(s) Performed: Procedure(s) (LRB) with comments: EXCISION MASS (Left) - LEFT SMALL EXCISION MASS & DEBRIDEMENT DIP JOINT IRRIGATION AND DEBRIDEMENT EXTREMITY (Left)  Patient Location: PACU  Anesthesia Type:MAC  Level of Consciousness: awake  Airway and Oxygen Therapy: Patient Spontanous Breathing  Post-op Pain: none  Post-op Assessment: Post-op Vital signs reviewed  Post-op Vital Signs: stable  Complications: No apparent anesthesia complications

## 2011-12-26 NOTE — Transfer of Care (Signed)
Immediate Anesthesia Transfer of Care Note  Patient: Mike Jenkins  Procedure(s) Performed: Procedure(s) (LRB) with comments: EXCISION MASS (Left) - LEFT SMALL EXCISION MASS & DEBRIDEMENT DIP JOINT IRRIGATION AND DEBRIDEMENT EXTREMITY (Left)  Patient Location: PACU  Anesthesia Type:Bier block  Level of Consciousness: awake, alert  and oriented  Airway & Oxygen Therapy: Patient Spontanous Breathing  Post-op Assessment: Report given to PACU RN and Post -op Vital signs reviewed and stable  Post vital signs: Reviewed and stable  Complications: No apparent anesthesia complications

## 2011-12-26 NOTE — Op Note (Signed)
Dictation [id number]

## 2011-12-27 ENCOUNTER — Encounter (HOSPITAL_BASED_OUTPATIENT_CLINIC_OR_DEPARTMENT_OTHER): Payer: Self-pay | Admitting: Orthopedic Surgery

## 2011-12-27 NOTE — Op Note (Signed)
Mike Jenkins, Mike Jenkins                 ACCOUNT NO.:  [id number]  MEDICAL RECORD NO.:  [id number]  LOCATION:                                 FACILITY:  PHYSICIAN:  Betha Loa, MD        DATE OF BIRTH:  10-Dec-1955  DATE OF PROCEDURE:  12/26/2011 DATE OF DISCHARGE:                              OPERATIVE REPORT   PREOPERATIVE DIAGNOSIS:  Left small finger mucoid cyst and distal interphalangeal joint arthrosis.  POSTOPERATIVE DIAGNOSIS:  Left small finger mucoid cyst and distal interphalangeal joint arthrosis.  PROCEDURE:  Left small finger excision of mucoid cyst and debridement of DIP joint.  SURGEON:  Betha Loa, MD  ASSISTANT:  None.  ANESTHESIA:  Bier block.  IV FLUIDS:  Per anesthesia flow sheet.  ESTIMATED BLOOD LOSS:  Minimal.  COMPLICATIONS:  None.  SPECIMENS:  Left small finger mucoid cyst to Pathology.  DISPOSITION:  Stable to PACU.  TOURNIQUET TIME:  33 minutes.  INDICATIONS:  Mr. Groesbeck is a 56 year old male who was noted a cyst on his left small finger for 1-2 months.  He popped this one time and got bloody gelatinous fluid out.  He wishes to have it removed.  On evaluation, he had an intact sensation and capillary refill on the fingertip.  Radiographs revealed DIP joint arthritis with osteophyte formation.  I discussed with Mr. Ozment the nature of the condition.  We discussed excision of the cyst and debridement of DIP joint.  Risks, benefits and alternatives of the surgery were discussed including risk of blood loss; infection; damage to nerves, vessels, tendons, ligaments, bone; failure of surgery; need for additional surgery; complications with wound healing; continued pain; recurrence of the cyst.  He voiced understanding of these risks and elected to proceed.  OPERATIVE COURSE:  After being identified preoperatively by myself, the patient and I agreed upon the procedure and site of procedure.  Surgical site was marked.  The risks, benefits, and  alternatives of the surgery were reviewed and he wished to proceed.  Surgical consent had been signed.  He was given 2 g of IV Ancef as preoperative antibiotic prophylaxis.  He was transported to the operating room and placed on the operating room table in supine position with left upper extremity on an armboard.  Bier block anesthesia was induced by the anesthesiologist. Left upper extremity was prepped and draped in normal sterile orthopedic fashion.  A surgical pause was performed between the surgeons, anesthesia, and operating staff, and all were in agreement as to the patient, procedure, and site of procedure.  Hockey-stick shaped incision was made at the DIP joint of the small finger and carried into subcutaneous tissues by spreading technique.  The cyst was identified and freed.  It was removed and sent to Pathology for examination.  The skin was intact over top.  The DIP joint was entered underneath the extensor tendon.  There was osteophyte formation.  The rongeurs were used to perform synovectomy and removal of the osteophytes.  The wound was then copiously irrigated with sterile saline.  It was closed with 4- 0 nylon in a horizontal mattress fashion.  A digital block was performed with  10 mL of 0.25% plain Marcaine to aid in postoperative analgesia. Wound was then dressed with sterile Xeroform, 4x4, and Alumafoam splint and wrapped with Coban dressing lightly.  Tourniquet was deflated at 33 minutes.  Operative drapes were broken down and the patient was transferred back to the stretcher.  He was taken to PACU in stable condition.  I will see him back in the office in 1 week for postoperative followup.  I will give him Norco 5/325 1-2 p.o. q.6 hours p.r.n. pain, dispensed #40.     Betha Loa, MD     KK/MEDQ  D:  12/26/2011  T:  12/27/2011  Job:  [id number]

## 2012-01-14 ENCOUNTER — Emergency Department (HOSPITAL_COMMUNITY)
Admission: EM | Admit: 2012-01-14 | Discharge: 2012-01-14 | Disposition: A | Discharge status: Home / Self Care | Payer: BC Managed Care – PPO | Source: Home / Self Care | Attending: Emergency Medicine | Admitting: Emergency Medicine

## 2012-01-14 ENCOUNTER — Encounter (HOSPITAL_COMMUNITY): Payer: Self-pay | Admitting: Emergency Medicine

## 2012-01-14 ENCOUNTER — Telehealth (HOSPITAL_COMMUNITY): Payer: Self-pay | Admitting: *Deleted

## 2012-01-14 DIAGNOSIS — R197 Diarrhea, unspecified: Secondary | ICD-10-CM

## 2012-01-14 DIAGNOSIS — R42 Dizziness and giddiness: Secondary | ICD-10-CM

## 2012-01-14 LAB — POCT I-STAT, CHEM 8
Creatinine, Ser: 0.9 mg/dL (ref 0.50–1.35)
HCT: 45 % (ref 39.0–52.0)
Hemoglobin: 15.3 g/dL (ref 13.0–17.0)
Potassium: 3.6 mEq/L (ref 3.5–5.1)
Sodium: 140 mEq/L (ref 135–145)
TCO2: 24 mmol/L (ref 0–100)

## 2012-01-14 LAB — POCT URINALYSIS DIP (DEVICE)
Bilirubin Urine: NEGATIVE
Leukocytes, UA: NEGATIVE
Nitrite: NEGATIVE
pH: 6 (ref 5.0–8.0)

## 2012-01-14 MED ORDER — ONDANSETRON 4 MG PO TBDP
8.0000 mg | ORAL_TABLET | Freq: Once | ORAL | Status: AC
  Administered 2012-01-14: 8 mg via ORAL

## 2012-01-14 MED ORDER — DIPHENHYDRAMINE HCL 25 MG PO CAPS
ORAL_CAPSULE | ORAL | Status: AC
  Filled 2012-01-14: qty 1

## 2012-01-14 MED ORDER — ONDANSETRON HCL 4 MG PO TABS: 4.0000 mg | ORAL_TABLET | Freq: Four times a day (QID) | ORAL | Status: DC

## 2012-01-14 MED ORDER — ONDANSETRON 4 MG PO TBDP
ORAL_TABLET | ORAL | Status: AC
  Filled 2012-01-14: qty 2

## 2012-01-14 MED ORDER — DIPHENHYDRAMINE HCL 25 MG PO CAPS
25.0000 mg | ORAL_CAPSULE | Freq: Once | ORAL | Status: AC
  Administered 2012-01-14: 25 mg via ORAL

## 2012-01-14 MED ORDER — MECLIZINE HCL 25 MG PO TABS: 25.0000 mg | ORAL_TABLET | Freq: Four times a day (QID) | ORAL | Status: DC

## 2012-01-14 NOTE — ED Provider Notes (Signed)
Medical screening examination/treatment/procedure(s) were performed by non-physician practitioner and as supervising physician I was immediately available for consultation/collaboration.  Raynald Blend, MD 01/14/12 2002

## 2012-01-14 NOTE — ED Notes (Signed)
WENT TO OBTAIN ISTAT, PATIENT STATED THAT HE URINATED WHEN HE FIRST ARRIVED AND COULD NOT PRODUCE A URINE SPECIMEN

## 2012-01-14 NOTE — ED Notes (Signed)
Continues not to have pain.  Patient reclined on exam table, reports dizziness continues .  Patient sat up and reported increase in dizziness.  Patient attempting to get urine specimen

## 2012-01-14 NOTE — ED Notes (Addendum)
The pharmacist from [facility] called and said they did not receive the Rx.'s.  Pt. is there to pick them up.  I accessed pt.'s chart and told her the Rx.'s were printed and pt. should have a purple paper with them on there.  She asked if I could give them to her over the phone because he does not have them. OK to give them to the pharmacist by Dr. Lorenz Coaster.  I read the Rx. for Antivert and Zofran to her as written by Lannie Fields NP.   Vassie Moselle 01/14/2012

## 2012-01-14 NOTE — ED Notes (Signed)
Patient reports suddenly feeling lightheaded and dizzy this afternoon.  Onset approx noon today.  Denies pain, pressure, discomfort in chest.  Patient is gray in color- clammy, warm, to touch.  Reports symptoms came on suddenly today.  Escorted to treatment room, requested to go to toilet. Returned to room reporting a diarrhea stool. Poor color with ambulation, color improves with lying down.  Continues to be dizzy.

## 2012-01-14 NOTE — ED Provider Notes (Signed)
History     CSN: [id number]  Arrival date & time 01/14/12  1400   First MD Initiated Contact with Patient 01/14/12 1444      Chief Complaint  Patient presents with  . Dizziness    (Consider location/radiation/quality/duration/timing/severity/associated sxs/prior treatment) Patient is a 56 y.o. male presenting with weakness. The history is provided by the patient.  Weakness The primary symptoms include dizziness. Primary symptoms do not include headaches, nausea or vomiting. The symptoms began 2 to 6 hours ago. The symptoms are worsening. The neurological symptoms are multifocal. The symptoms occurred after standing up.  He describes the dizziness as a sensation of spinning. The dizziness began today. The dizziness has been rapidly worsening since its onset. It is a new problem. The dizziness is associated with defecation. Dizziness also occurs with weakness. Dizziness does not occur with blurred vision, tinnitus, hearing loss, nausea, vomiting or diaphoresis.  Additional symptoms include weakness. Additional symptoms do not include neck stiffness, pain, loss of balance, photophobia, nystagmus or tinnitus. Medical issues also include hypertension. Medical issues do not include seizures or cerebral vascular accident. Associated medical issues comments: reports htn controlled, has been out of hypertensive medications for one week.  known history of heart murmur since childhood..  Patient suspects leftover food as causative agent of symptoms.    Past Medical History  Diagnosis Date  . Hypertension   . Heart murmur     since he was a baby-echo done preop hip surg 2003-    Past Surgical History  Procedure Date  . Total hip arthroplasty LEFT  . Joint replacement 2003    left total hip  . Knee arthroscopy     right and left  . Colonoscopy   . Mass excision 12/26/2011    Procedure: EXCISION MASS;  Surgeon: Tami Ribas, MD;  Location: [facility];  Service: Orthopedics;   Laterality: Left;  LEFT SMALL EXCISION MASS & DEBRIDEMENT DIP JOINT  . I&d extremity 12/26/2011    Procedure: IRRIGATION AND DEBRIDEMENT EXTREMITY;  Surgeon: Tami Ribas, MD;  Location:  SURGERY CENTER;  Service: Orthopedics;  Laterality: Left;    No family history on file.  History  Substance Use Topics  . Smoking status: Never Smoker   . Smokeless tobacco: Not on file  . Alcohol Use: Yes      Review of Systems  Constitutional: Negative for diaphoresis.  HENT: Negative for congestion, rhinorrhea, neck stiffness, sinus pressure and tinnitus.   Eyes: Negative for blurred vision and photophobia.  Respiratory: Negative for shortness of breath.   Cardiovascular: Negative for chest pain.  Gastrointestinal: Positive for diarrhea. Negative for nausea and vomiting.  Neurological: Positive for dizziness and weakness. Negative for syncope, headaches and loss of balance.  All other systems reviewed and are negative.    Allergies  Review of patient's allergies indicates no known allergies.  Home Medications   Current Outpatient Rx  Name  Route  Sig  Dispense  Refill  . HYDROCODONE-ACETAMINOPHEN 5-325 MG PO TABS      1-2 tabs po q6 hours prn pain   40 tablet   0   . ALTACE PO   Oral   Take by mouth daily.         Marland Kitchen MECLIZINE HCL 25 MG PO TABS   Oral   Take 1 tablet (25 mg total) by mouth 4 (four) times daily.   28 tablet   0   . ONDANSETRON HCL 4 MG PO TABS  Oral   Take 1 tablet (4 mg total) by mouth every 6 (six) hours.   12 tablet   0     BP 144/91  Pulse 67  Resp 18  SpO2 100%  Physical Exam  Nursing note and vitals reviewed. Constitutional: He is oriented to person, place, and time. Vital signs are normal. He appears well-developed and well-nourished. He is active and cooperative.  HENT:  Head: Normocephalic.  Right Ear: External ear normal.  Left Ear: External ear normal.  Nose: Nose normal.  Mouth/Throat: Oropharynx is clear and moist.  No oropharyngeal exudate.  Eyes: Conjunctivae normal and EOM are normal. Pupils are equal, round, and reactive to light. No scleral icterus.       EOM reproduces symptoms  Neck: Trachea normal and normal range of motion. Neck supple. Carotid bruit is not present. No thyromegaly present.  Cardiovascular: Normal rate, regular rhythm, intact distal pulses and normal pulses.   Murmur heard. Pulmonary/Chest: Effort normal and breath sounds normal.  Abdominal: Soft. Normal appearance and bowel sounds are normal. There is no tenderness. There is no rebound and no CVA tenderness.  Musculoskeletal: Normal range of motion.  Lymphadenopathy:       Head (right side): No submental, no submandibular, no tonsillar, no preauricular, no posterior auricular and no occipital adenopathy present.       Head (left side): No submental, no submandibular, no tonsillar, no preauricular, no posterior auricular and no occipital adenopathy present.    He has no cervical adenopathy.  Neurological: He is alert and oriented to person, place, and time. He has normal strength and normal reflexes. No cranial nerve deficit or sensory deficit. Coordination and gait normal. GCS eye subscore is 4. GCS verbal subscore is 5. GCS motor subscore is 6.  Skin: Skin is warm, dry and intact.  Psychiatric: He has a normal mood and affect. His speech is normal and behavior is normal. Judgment and thought content normal. Cognition and memory are normal.    ED Course  Procedures (including critical care time)  Labs Reviewed  POCT I-STAT, CHEM 8 - Abnormal; Notable for the following:    Glucose, Bld 120 (*)     All other components within normal limits  POCT URINALYSIS DIP (DEVICE) - Abnormal; Notable for the following:    Glucose, UA 250 (*)     Ketones, ur TRACE (*)     Hgb urine dipstick TRACE (*)     All other components within normal limits   No results found.   1. Dizziness   2. Diarrhea       MDM  1622 Reports symptoms  persist, will await effect of medication administered.   Symptoms improved at discharge post benadryl and zofran.  Discussed with pt warning symptoms that warrant immediate return.        Johnsie Kindred, NP 01/14/12 1750

## 2012-04-15 ENCOUNTER — Encounter: Payer: Self-pay | Admitting: Internal Medicine

## 2012-08-26 ENCOUNTER — Encounter: Payer: Self-pay | Admitting: Internal Medicine

## 2012-11-16 ENCOUNTER — Ambulatory Visit (AMBULATORY_SURGERY_CENTER): Payer: Self-pay | Admitting: *Deleted

## 2012-11-16 VITALS — Ht 74.0 in | Wt 238.6 lb

## 2012-11-16 DIAGNOSIS — Z86010 Personal history of colonic polyps: Secondary | ICD-10-CM

## 2012-11-16 MED ORDER — MOVIPREP 100 G PO SOLR: ORAL | Status: DC

## 2012-11-16 NOTE — Progress Notes (Signed)
No allergies to eggs or soy. No problems with anesthesia.  

## 2012-11-17 ENCOUNTER — Encounter: Payer: Self-pay | Admitting: Internal Medicine

## 2012-11-27 ENCOUNTER — Encounter: Payer: Self-pay | Admitting: Internal Medicine

## 2012-11-27 ENCOUNTER — Ambulatory Visit (AMBULATORY_SURGERY_CENTER): Payer: BC Managed Care – PPO | Admitting: Internal Medicine

## 2012-11-27 VITALS — BP 148/86 | HR 61 | Temp 96.9°F | Resp 18 | Ht 74.0 in | Wt 238.0 lb

## 2012-11-27 DIAGNOSIS — D126 Benign neoplasm of colon, unspecified: Secondary | ICD-10-CM

## 2012-11-27 DIAGNOSIS — Z86010 Personal history of colonic polyps: Secondary | ICD-10-CM

## 2012-11-27 MED ORDER — SODIUM CHLORIDE 0.9 % IV SOLN: 500.0000 mL | INTRAVENOUS | Status: DC

## 2012-11-27 NOTE — Progress Notes (Signed)
Patient did not experience any of the following events: a burn prior to discharge; a fall within the facility; wrong site/side/patient/procedure/implant event; or a hospital transfer or hospital admission upon discharge from the facility. (G8907) Patient did not have preoperative order for IV antibiotic SSI prophylaxis. (G8918)  

## 2012-11-27 NOTE — Op Note (Signed)
[facility] [address] [place], [address]   COLONOSCOPY PROCEDURE REPORT  PATIENT: Mike, Jenkins  MR#: [id number] BIRTHDATE: Jul 17, 1955 , 57  yrs. old GENDER: Male ENDOSCOPIST: Roxy Cedar, MD REFERRED GN:[id number] Program Recall PROCEDURE DATE:  11/27/2012 PROCEDURE:   Colonoscopy with snare polypectomy x 1 First Screening Colonoscopy - Avg.  risk and is 50 yrs.  old or older - No.  Prior Negative Screening - Now for repeat screening. N/A  History of Adenoma - Now for follow-up colonoscopy & has been > or = to 3 yrs.  Yes hx of adenoma.  Has been 3 or more years since last colonoscopy.  Polyps Removed Today? Yes. ASA CLASS:   Class II INDICATIONS:Patient's personal history of adenomatous colon polyps. Index 02-2007 (small TA) MEDICATIONS: MAC sedation, administered by CRNA and propofol (Diprivan) 500mg  IV  DESCRIPTION OF PROCEDURE:   After the risks benefits and alternatives of the procedure were thoroughly explained, informed consent was obtained.  A digital rectal exam revealed no abnormalities of the rectum.   The LB [id number]  endoscope was introduced through the anus and advanced to the cecum, which was identified by both the appendix and ileocecal valve. No adverse events experienced.   The quality of the prep was excellent, using MoviPrep  The instrument was then slowly withdrawn as the colon was fully examined.  COLON FINDINGS: A diminutive polyp was found in the sigmoid colon. A polypectomy was performed with a cold snare.  The resection was complete and the polyp tissue was completely retrieved.   Mild diverticulosis was noted The finding was in the left colon.   The colon mucosa was otherwise normal.  Retroflexed views revealed internal hemorrhoids. The time to cecum=2 minutes 15 seconds. Withdrawal time=11 minutes 01 seconds.  The scope was withdrawn and the procedure completed. COMPLICATIONS: There were no  complications.  ENDOSCOPIC IMPRESSION: 1.   Diminutive polyp was found in the sigmoid colon; polypectomy was performed with a cold snare 2.   Mild diverticulosis was noted in the left colon 3.   The colon mucosa was otherwise normal  RECOMMENDATIONS: 1. Repeat colonoscopy in 5 years if polyp adenomatous; otherwise 10 years   eSigned:  Roxy Cedar, MD 11/27/2012 9:53 AM   cc: Geoffry Paradise, MD and The Patient

## 2012-11-27 NOTE — Patient Instructions (Signed)

## 2012-11-27 NOTE — Progress Notes (Signed)
Called to room to assist during endoscopic procedure.  Patient ID and intended procedure confirmed with present staff. Received instructions for my participation in the procedure from the performing physician.  

## 2012-11-27 NOTE — Progress Notes (Signed)
No egg or soy allergy. ewm 

## 2012-11-30 ENCOUNTER — Telehealth: Payer: Self-pay | Admitting: *Deleted

## 2012-11-30 NOTE — Telephone Encounter (Signed)
  Follow up Call-  Call back number 11/27/2012  Post procedure Call Back phone  # [phone]  Permission to leave phone message Yes     Patient questions:  Do you have a fever, pain , or abdominal swelling? no Pain Score  0 *  Have you tolerated food without any problems? yes  Have you been able to return to your normal activities? yes  Do you have any questions about your discharge instructions: Diet   no Medications  no Follow up visit  no  Do you have questions or concerns about your Care? no  Actions: * If pain score is 4 or above: No action needed, pain <4.

## 2012-12-01 ENCOUNTER — Encounter: Payer: Self-pay | Admitting: Internal Medicine

## 2013-11-22 ENCOUNTER — Other Ambulatory Visit: Payer: Self-pay | Admitting: Internal Medicine

## 2013-11-22 DIAGNOSIS — R1031 Right lower quadrant pain: Secondary | ICD-10-CM

## 2013-11-23 ENCOUNTER — Ambulatory Visit
Admission: RE | Admit: 2013-11-23 | Discharge: 2013-11-23 | Disposition: A | Discharge status: Home / Self Care | Payer: BC Managed Care – PPO | Source: Ambulatory Visit | Attending: Internal Medicine | Admitting: Internal Medicine

## 2013-11-23 DIAGNOSIS — R1031 Right lower quadrant pain: Secondary | ICD-10-CM

## 2013-11-23 MED ORDER — IOHEXOL 300 MG/ML  SOLN
125.0000 mL | Freq: Once | INTRAMUSCULAR | Status: AC | PRN
  Administered 2013-11-23: 125 mL via INTRAVENOUS

## 2013-11-24 ENCOUNTER — Telehealth: Payer: Self-pay | Admitting: Internal Medicine

## 2013-11-24 NOTE — Telephone Encounter (Signed)
Pt having problems with pain in his RUQ. Request pt be seen asap. Pt scheduled to see Dr. Henrene Pastor tomorrow at 10:15am. Maudie Mercury to notify pt of appt.

## 2013-11-25 ENCOUNTER — Ambulatory Visit
Admission: RE | Admit: 2013-11-25 | Discharge: 2013-11-25 | Disposition: A | Discharge status: Home / Self Care | Payer: BC Managed Care – PPO | Source: Ambulatory Visit | Attending: Internal Medicine | Admitting: Internal Medicine

## 2013-11-25 ENCOUNTER — Ambulatory Visit (INDEPENDENT_AMBULATORY_CARE_PROVIDER_SITE_OTHER): Payer: BC Managed Care – PPO | Admitting: Internal Medicine

## 2013-11-25 ENCOUNTER — Encounter: Payer: Self-pay | Admitting: Internal Medicine

## 2013-11-25 ENCOUNTER — Other Ambulatory Visit: Payer: BC Managed Care – PPO

## 2013-11-25 VITALS — BP 122/80 | HR 64 | Ht 73.0 in | Wt 232.0 lb

## 2013-11-25 DIAGNOSIS — M79604 Pain in right leg: Secondary | ICD-10-CM

## 2013-11-25 DIAGNOSIS — M545 Low back pain, unspecified: Secondary | ICD-10-CM

## 2013-11-25 DIAGNOSIS — R1011 Right upper quadrant pain: Secondary | ICD-10-CM

## 2013-11-25 DIAGNOSIS — R935 Abnormal findings on diagnostic imaging of other abdominal regions, including retroperitoneum: Secondary | ICD-10-CM

## 2013-11-25 NOTE — Progress Notes (Signed)
HISTORY OF PRESENT ILLNESS:  Mike Jenkins is a 58 y.o. male with a history of adenomatous colon polyps for which he is undergone colonoscopy in 2009 and most recently October 2014 (small hyperplastic polyp only). He is sent today regarding progressive right lower quadrant pain and abnormal CT scan findings. The patient reports developing right lower quadrant pain approximately 2 months ago. He was seen by Dr. Reynaldo Minium and the question of early hernia or strain raised. Activity level was curved with some improvement. However, the discomfort then worsened. As well there was discomfort into the right lower back and flank region. Discomfort is noted exclusively with standing and can be severe. However, sitting and lying release pain completely and immediately. He also has noticed more recently a burning discomfort into the right anterior thigh associated with pain upon standing. The patient did undergo a CT scan with contrast of the abdomen and pelvis 11/23/2013. No acute findings noted. Nonspecific slight haziness of the root of the small bowel mesentery with borderline enlarged lymph nodes noted. However, this was unchanged from greater than 3 years previous. As well slight enlargement of the adrenal nodule (spoke to Dr. Reynaldo Minium today, he is aware and we'll followup on this). Patient's GI review of systems is otherwise negative  REVIEW OF SYSTEMS:  All non-GI ROS negative except for back pain, heart murmur  Past Medical History  Diagnosis Date  . Hypertension   . Heart murmur     since he was a baby-echo done preop hip surg 2003-  . Colon polyps     hyperplastic  . Diverticulosis     Past Surgical History  Procedure Laterality Date  . Total hip arthroplasty Left 2003  . Knee arthroscopy Bilateral 1995, 1998    right and left  . Colonoscopy    . Mass excision  12/26/2011    Procedure: EXCISION MASS;  Surgeon: Tennis Must, MD;  Location: [place];  Service: Orthopedics;   Laterality: Left;  LEFT SMALL EXCISION MASS & DEBRIDEMENT DIP JOINT  . I&d extremity  12/26/2011    Procedure: IRRIGATION AND DEBRIDEMENT EXTREMITY;  Surgeon: Tennis Must, MD;  Location: [place];  Service: Orthopedics;  Laterality: Left;  . Polypectomy      Social History Mike Jenkins  reports that he has never smoked. He has never used smokeless tobacco. He reports that he drinks about 1.8 ounces of alcohol per week. He reports that he does not use illicit drugs.  family history is negative for Colon cancer, Esophageal cancer, Rectal cancer, and Stomach cancer.  No Known Allergies     PHYSICAL EXAMINATION: Vital signs: BP 122/80  Pulse 64  Ht 6\' 1"  (1.854 m)  Wt 232 lb (105.235 kg)  BMI 30.62 kg/m2  Constitutional: generally well-appearing, no acute distress Psychiatric: alert and oriented x3, cooperative Eyes: extraocular movements intact, anicteric, conjunctiva pink Mouth: oral pharynx moist, no lesions Neck: supple no lymphadenopathy Cardiovascular: heart regular rate and rhythm, no murmur Lungs: clear to auscultation bilaterally Abdomen: soft, nontender, nondistended, no obvious ascites, no peritoneal signs, normal bowel sounds, no organomegaly. No obvious bulge or hernia with patient lying or standing. Rectal: Omitted Extremities: no lower extremity edema bilaterally Skin: no lesions on visible extremities Neuro: No focal deficits. Normal deep tendon reflexes bilaterally    ASSESSMENT:  #1. Positional right lower quadrant, right back, and right anterior thigh (paresthesia) pain as described. Suspect low back lesion such as disc. I do not think this is  a primary GI problem. #2. CT scan abnormalities. Incidental and not related. #3. On colonoscopy October 2014 with minimal findings. History of diminutive adenoma 2009   PLAN:  #1. I contacted Dr. Reynaldo Minium to discuss further. We will move forward with MRI of the lumbar spine. That will be performed  later today. Management thereafter per Dr. Reynaldo Minium. #2. Surveillance colonoscopy around October 2024. Interval followup as needed

## 2013-11-25 NOTE — Patient Instructions (Addendum)
You have been scheduled for an MRI of your lower right back and lower right leg today 11/26/2013 at 3:30.  Please arrive at 3:15.  Go to [facility], [address].  The Phone number is [phone]

## 2013-12-20 ENCOUNTER — Emergency Department (HOSPITAL_COMMUNITY): Payer: BC Managed Care – PPO

## 2013-12-20 ENCOUNTER — Encounter (HOSPITAL_COMMUNITY): Payer: Self-pay | Admitting: Emergency Medicine

## 2013-12-20 ENCOUNTER — Emergency Department (HOSPITAL_COMMUNITY)
Admission: EM | Admit: 2013-12-20 | Discharge: 2013-12-20 | Disposition: A | Discharge status: Home / Self Care | Payer: BC Managed Care – PPO | Attending: Emergency Medicine | Admitting: Emergency Medicine

## 2013-12-20 DIAGNOSIS — I10 Essential (primary) hypertension: Secondary | ICD-10-CM | POA: Diagnosis not present

## 2013-12-20 DIAGNOSIS — Z79899 Other long term (current) drug therapy: Secondary | ICD-10-CM | POA: Insufficient documentation

## 2013-12-20 DIAGNOSIS — Z9049 Acquired absence of other specified parts of digestive tract: Secondary | ICD-10-CM | POA: Diagnosis not present

## 2013-12-20 DIAGNOSIS — R011 Cardiac murmur, unspecified: Secondary | ICD-10-CM | POA: Diagnosis not present

## 2013-12-20 DIAGNOSIS — R1031 Right lower quadrant pain: Secondary | ICD-10-CM | POA: Diagnosis present

## 2013-12-20 DIAGNOSIS — Z86010 Personal history of colonic polyps: Secondary | ICD-10-CM | POA: Diagnosis not present

## 2013-12-20 DIAGNOSIS — Z9889 Other specified postprocedural states: Secondary | ICD-10-CM | POA: Diagnosis not present

## 2013-12-20 DIAGNOSIS — Z8719 Personal history of other diseases of the digestive system: Secondary | ICD-10-CM | POA: Diagnosis not present

## 2013-12-20 LAB — COMPREHENSIVE METABOLIC PANEL
ALBUMIN: 4.2 g/dL (ref 3.5–5.2)
ALT: 25 U/L (ref 0–53)
AST: 18 U/L (ref 0–37)
Alkaline Phosphatase: 50 U/L (ref 39–117)
Anion gap: 18 — ABNORMAL HIGH (ref 5–15)
BILIRUBIN TOTAL: 0.7 mg/dL (ref 0.3–1.2)
BUN: 17 mg/dL (ref 6–23)
CHLORIDE: 100 meq/L (ref 96–112)
CO2: 22 meq/L (ref 19–32)
CREATININE: 0.93 mg/dL (ref 0.50–1.35)
Calcium: 9.9 mg/dL (ref 8.4–10.5)
GFR calc Af Amer: >90 mL/min (ref >90)
GFR calc non Af Amer: >90 mL/min (ref >90)
Glucose, Bld: 113 mg/dL — ABNORMAL HIGH (ref 70–99)
POTASSIUM: 4.2 meq/L (ref 3.7–5.3)
SODIUM: 140 meq/L (ref 137–147)
Total Protein: 7.3 g/dL (ref 6.0–8.3)

## 2013-12-20 LAB — CBC WITH DIFFERENTIAL/PLATELET
BASOS ABS: 0 10*3/uL (ref 0.0–0.1)
Basophils Relative: 0 % (ref 0–1)
Eosinophils Absolute: 0.2 10*3/uL (ref 0.0–0.7)
Eosinophils Relative: 2 % (ref 0–5)
HEMATOCRIT: 46.8 % (ref 39.0–52.0)
HEMOGLOBIN: 16.4 g/dL (ref 13.0–17.0)
LYMPHS PCT: 43 % (ref 12–46)
Lymphs Abs: 2.7 10*3/uL (ref 0.7–4.0)
MCH: 32.1 pg (ref 26.0–34.0)
MCHC: 35 g/dL (ref 30.0–36.0)
MCV: 91.6 fL (ref 78.0–100.0)
MONO ABS: 0.4 10*3/uL (ref 0.1–1.0)
Monocytes Relative: 7 % (ref 3–12)
NEUTROS PCT: 48 % (ref 43–77)
Neutro Abs: 3 10*3/uL (ref 1.7–7.7)
Platelets: 157 10*3/uL (ref 150–400)
RBC: 5.11 MIL/uL (ref 4.22–5.81)
RDW: 12.5 % (ref 11.5–15.5)
WBC: 6.3 10*3/uL (ref 4.0–10.5)

## 2013-12-20 LAB — URINALYSIS, ROUTINE W REFLEX MICROSCOPIC
Bilirubin Urine: NEGATIVE
Glucose, UA: NEGATIVE mg/dL
HGB URINE DIPSTICK: NEGATIVE
KETONES UR: NEGATIVE mg/dL
Leukocytes, UA: NEGATIVE
Nitrite: NEGATIVE
PH: 7.5 (ref 5.0–8.0)
Protein, ur: NEGATIVE mg/dL
SPECIFIC GRAVITY, URINE: 1.013 (ref 1.005–1.030)
Urobilinogen, UA: 0.2 mg/dL (ref 0.0–1.0)

## 2013-12-20 LAB — LIPASE, BLOOD: Lipase: 18 U/L (ref 11–59)

## 2013-12-20 MED ORDER — MORPHINE SULFATE 4 MG/ML IJ SOLN
4.0000 mg | Freq: Once | INTRAMUSCULAR | Status: AC
  Administered 2013-12-20: 4 mg via INTRAVENOUS
  Filled 2013-12-20: qty 1

## 2013-12-20 MED ORDER — OXYCODONE HCL 5 MG PO TABS: 5.0000 mg | ORAL_TABLET | ORAL | Status: DC | PRN

## 2013-12-20 MED ORDER — SODIUM CHLORIDE 0.9 % IV BOLUS (SEPSIS)
1000.0000 mL | Freq: Once | INTRAVENOUS | Status: AC
  Administered 2013-12-20: 1000 mL via INTRAVENOUS

## 2013-12-20 MED ORDER — KETOROLAC TROMETHAMINE 30 MG/ML IJ SOLN
30.0000 mg | Freq: Once | INTRAMUSCULAR | Status: AC
  Administered 2013-12-20: 30 mg via INTRAVENOUS
  Filled 2013-12-20: qty 1

## 2013-12-20 MED ORDER — IOHEXOL 300 MG/ML  SOLN: 25.0000 mL | INTRAMUSCULAR | Status: AC

## 2013-12-20 MED ORDER — ONDANSETRON HCL 4 MG/2ML IJ SOLN
4.0000 mg | Freq: Once | INTRAMUSCULAR | Status: AC
  Administered 2013-12-20: 4 mg via INTRAVENOUS
  Filled 2013-12-20: qty 2

## 2013-12-20 MED ORDER — IOHEXOL 300 MG/ML  SOLN
100.0000 mL | Freq: Once | INTRAMUSCULAR | Status: AC | PRN
  Administered 2013-12-20: 100 mL via INTRAVENOUS

## 2013-12-20 NOTE — ED Provider Notes (Signed)
CSN: [id number]     Arrival date & time 12/20/13  0802 History   First MD Initiated Contact with Patient 12/20/13 0804     Chief Complaint  Patient presents with  . Abdominal Pain     (Consider location/radiation/quality/duration/timing/severity/associated sxs/prior Treatment) HPI Comments: Patient is a 58 year old male with a past medical history of hypertension, diverticulosis, and colon polyps who presents with a 3 month history of abdominal pain. The pain is located in the RLQ and radiates to right flank and down the anterior right leg. The pain is described as shooting and severe. The pain started gradually and remains intermittent periods of worsening since the onset. No known trigger. No alleviating/aggravating factors. The patient has tried hydrocodone for symptoms with some relief. Associated symptoms include nothing. Patient has seen his PCP and GI doctor for his symptoms. He had a CT scan and MRI done without acute findings. Patient denies fever, headache, NVD, chest pain, SOB, dysuria, constipation. Patient reports no changes in bowel habits or eating habits. No history of abdominal surgery.    Past Medical History  Diagnosis Date  . Hypertension   . Heart murmur     since he was a baby-echo done preop hip surg 2003-  . Colon polyps     hyperplastic  . Diverticulosis    Past Surgical History  Procedure Laterality Date  . Total hip arthroplasty Left 2003  . Knee arthroscopy Bilateral 1995, 1998    right and left  . Colonoscopy    . Mass excision  12/26/2011    Procedure: EXCISION MASS;  Surgeon: Tennis Must, MD;  Location: [place];  Service: Orthopedics;  Laterality: Left;  LEFT SMALL EXCISION MASS & DEBRIDEMENT DIP JOINT  . I&d extremity  12/26/2011    Procedure: IRRIGATION AND DEBRIDEMENT EXTREMITY;  Surgeon: Tennis Must, MD;  Location: [place];  Service: Orthopedics;  Laterality: Left;  . Polypectomy     Family History  Problem  Relation Age of Onset  . Colon cancer Neg Hx   . Esophageal cancer Neg Hx   . Rectal cancer Neg Hx   . Stomach cancer Neg Hx    History  Substance Use Topics  . Smoking status: Never Smoker   . Smokeless tobacco: Never Used  . Alcohol Use: 1.8 oz/week    3 Cans of beer per week    Review of Systems  Gastrointestinal: Positive for abdominal pain.  All other systems reviewed and are negative.     Allergies  Review of patient's allergies indicates no known allergies.  Home Medications   Prior to Admission medications   Medication Sig Start Date End Date Taking? Authorizing Provider  HYDROcodone-acetaminophen (NORCO/VICODIN) 5-325 MG per tablet Take 1 tablet by mouth as needed.  11/24/13   Historical Provider, MD  losartan (COZAAR) 100 MG tablet Take 100 mg by mouth daily.    Historical Provider, MD   BP 102/85 mmHg  Temp(Src) 98.7 F (37.1 C) (Oral)  Resp 26  SpO2 100% Physical Exam  Constitutional: He is oriented to person, place, and time. He appears well-developed and well-nourished. No distress.  HENT:  Head: Normocephalic and atraumatic.  Eyes: Conjunctivae and EOM are normal.  Neck: Normal range of motion.  Cardiovascular: Normal rate and regular rhythm.  Exam reveals no gallop and no friction rub.   No murmur heard. Pulmonary/Chest: Effort normal and breath sounds normal. He has no wheezes. He has no rales. He exhibits no tenderness.  Abdominal: Soft. He exhibits no distension. There is tenderness. There is no rebound and no guarding.  Mild RLQ tenderness to palpation. No other focal tenderness or peritoneal signs.   Genitourinary:  No CVA tenderness  Musculoskeletal: Normal range of motion.  Neurological: He is alert and oriented to person, place, and time. Coordination normal.  Speech is goal-oriented. Moves limbs without ataxia.   Skin: Skin is warm and dry.  Psychiatric: He has a normal mood and affect. His behavior is normal.  Nursing note and vitals  reviewed.   ED Course  Procedures (including critical care time) Labs Review Labs Reviewed  COMPREHENSIVE METABOLIC PANEL - Abnormal; Notable for the following:    Glucose, Bld 113 (*)    Anion gap 18 (*)    All other components within normal limits  CBC WITH DIFFERENTIAL  LIPASE, BLOOD  URINALYSIS, ROUTINE W REFLEX MICROSCOPIC    Imaging Review Ct Abdomen Pelvis W Contrast  12/20/2013   CLINICAL DATA:  Progressive right lower quadrant pain over last 3 months.  EXAM: CT ABDOMEN AND PELVIS WITH CONTRAST  TECHNIQUE: Multidetector CT imaging of the abdomen and pelvis was performed using the standard protocol following bolus administration of intravenous contrast.  CONTRAST:  170mL OMNIPAQUE IOHEXOL 300 MG/ML  SOLN  COMPARISON:  11/23/2013 and 09/10/2010  FINDINGS: Lower Chest:  Unremarkable.  Hepatobiliary: Several tiny hepatic cysts are again noted. No liver masses are identified. Gallbladder is unremarkable.  Pancreas: No mass, inflammatory changes, or other parenchymal abnormality identified.  Spleen:  Within normal limits in size and appearance.  Adrenal Glands: A 1.9 cm right adrenal nodule is stable and consistent with a benign adrenal adenoma. Left adrenal gland appears normal.  Kidneys/Urinary Tract: No masses identified. No evidence of hydronephrosis.  Stomach/Bowel/Peritoneum: No evidence of wall thickening, mass, or obstruction. Normal appendix is visualized.  Vascular/Lymphatic: No pathologically enlarged lymph nodes identified. Scattered sub-cm mesenteric lymph nodes remain stable. No other significant abnormality identified.  Reproductive:  No mass or other significant abnormality identified.  Other:  Left hip prosthesis again noted.  Musculoskeletal:  No suspicious bone lesions identified.  IMPRESSION: Stable exam.  No acute findings.  Stable small right adrenal adenoma and tiny hepatic cysts incidentally noted.   Electronically Signed   By: Earle Gell M.D.   On: 12/20/2013 11:00      EKG Interpretation None      MDM   Final diagnoses:  RLQ abdominal pain    9:22 AM Labs unremarkable for acute changes. Vitals stable and patient afebrile. Patient given morphine and zofran for pain. Patient will have reimaging of abdomen and pelvis.   11:23 AM Patient's CT unremarkable for acute changes. Patient will have oxycodone for pain and recommended follow up with his PCP and orthopedist. Vitals stable and patient afebrile. Dr Venora Maples saw the patient and agrees with the plan.   Alvina Chou, PA-C 12/20/13 [place], MD 12/20/13 1143

## 2013-12-20 NOTE — ED Notes (Signed)
MD at bedside. 

## 2013-12-20 NOTE — Discharge Instructions (Signed)
Take oxycodone as needed for pain. Refer to attached documents for more information. Follow up with your primary care provider and your Orthopedic doctor for further evaluation.

## 2013-12-20 NOTE — ED Notes (Signed)
Patient states he been having RLQ pain since Aug. Patient states pain has gotten processive worse. Patient states he has been on Neurontin  and Prednisone. Patient states the pain worsen with standing. Patient states he has has a CT done and nothing was found.

## 2013-12-20 NOTE — ED Notes (Signed)
PA at bedside.

## 2017-11-18 DIAGNOSIS — R0609 Other forms of dyspnea: Secondary | ICD-10-CM | POA: Diagnosis not present

## 2017-11-18 DIAGNOSIS — I10 Essential (primary) hypertension: Secondary | ICD-10-CM | POA: Diagnosis not present

## 2017-11-18 DIAGNOSIS — R9431 Abnormal electrocardiogram [ECG] [EKG]: Secondary | ICD-10-CM | POA: Diagnosis not present

## 2017-11-21 DIAGNOSIS — R011 Cardiac murmur, unspecified: Secondary | ICD-10-CM | POA: Diagnosis not present

## 2017-11-21 DIAGNOSIS — I10 Essential (primary) hypertension: Secondary | ICD-10-CM | POA: Diagnosis not present

## 2017-11-21 DIAGNOSIS — R7301 Impaired fasting glucose: Secondary | ICD-10-CM | POA: Diagnosis not present

## 2017-12-01 DIAGNOSIS — R0609 Other forms of dyspnea: Secondary | ICD-10-CM | POA: Diagnosis not present

## 2017-12-10 DIAGNOSIS — R011 Cardiac murmur, unspecified: Secondary | ICD-10-CM | POA: Diagnosis not present

## 2017-12-10 DIAGNOSIS — R9431 Abnormal electrocardiogram [ECG] [EKG]: Secondary | ICD-10-CM | POA: Diagnosis not present

## 2017-12-16 DIAGNOSIS — R9431 Abnormal electrocardiogram [ECG] [EKG]: Secondary | ICD-10-CM | POA: Diagnosis not present

## 2017-12-16 DIAGNOSIS — R0609 Other forms of dyspnea: Secondary | ICD-10-CM | POA: Diagnosis not present

## 2017-12-16 DIAGNOSIS — I10 Essential (primary) hypertension: Secondary | ICD-10-CM | POA: Diagnosis not present

## 2018-03-10 DIAGNOSIS — Z08 Encounter for follow-up examination after completed treatment for malignant neoplasm: Secondary | ICD-10-CM | POA: Diagnosis not present

## 2018-03-10 DIAGNOSIS — Z85820 Personal history of malignant melanoma of skin: Secondary | ICD-10-CM | POA: Diagnosis not present

## 2018-03-10 DIAGNOSIS — Z1283 Encounter for screening for malignant neoplasm of skin: Secondary | ICD-10-CM | POA: Diagnosis not present

## 2018-06-10 ENCOUNTER — Other Ambulatory Visit: Payer: Self-pay | Admitting: Cardiology

## 2018-06-10 DIAGNOSIS — I77810 Thoracic aortic ectasia: Secondary | ICD-10-CM

## 2018-07-03 DIAGNOSIS — Z85820 Personal history of malignant melanoma of skin: Secondary | ICD-10-CM | POA: Diagnosis not present

## 2018-07-03 DIAGNOSIS — Z08 Encounter for follow-up examination after completed treatment for malignant neoplasm: Secondary | ICD-10-CM | POA: Diagnosis not present

## 2018-07-03 DIAGNOSIS — Z1283 Encounter for screening for malignant neoplasm of skin: Secondary | ICD-10-CM | POA: Diagnosis not present

## 2018-09-25 ENCOUNTER — Telehealth: Payer: Self-pay

## 2018-10-30 ENCOUNTER — Encounter: Payer: Self-pay | Admitting: Internal Medicine

## 2018-11-18 ENCOUNTER — Ambulatory Visit: Payer: 59 | Admitting: Pulmonary Disease

## 2018-11-18 ENCOUNTER — Other Ambulatory Visit: Payer: Self-pay

## 2018-11-18 ENCOUNTER — Encounter: Payer: Self-pay | Admitting: Pulmonary Disease

## 2018-11-18 DIAGNOSIS — R0609 Other forms of dyspnea: Secondary | ICD-10-CM

## 2018-11-18 DIAGNOSIS — R0600 Dyspnea, unspecified: Secondary | ICD-10-CM | POA: Diagnosis not present

## 2018-11-18 DIAGNOSIS — R0602 Shortness of breath: Secondary | ICD-10-CM | POA: Diagnosis not present

## 2018-11-18 NOTE — Assessment & Plan Note (Addendum)
No etiology apparent He does have bibasilar crackles on exam and I think we have to rule out ILD, in this age group of IPF can have an insidious onset and cause mild dyspnea that has remained steady for about a year. We will proceed with high-resolution CT chest Does not Desaturate on walking and that is reassuring  Does not have any risk factors for VTE or pulmonary hypertension. Cardiac etiologies have been ruled out  If this is negative then can obtain PFTs and work on a conditioning program

## 2018-11-18 NOTE — Patient Instructions (Signed)
High-resolution CT chest to rule out lung fibrosis Oxygen level stayed steady on walking If that test comes out normal then would suggest an exercise program

## 2018-11-18 NOTE — Progress Notes (Signed)
Subjective:    Patient ID: Mike Jenkins, male    DOB: 08/06/1955, 63 y.o.   MRN: [id number]  HPI  Chief Complaint  Patient presents with  . Consult    Increased shortness of breath in the last 6 months. Taking longer to recover after exercise.     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 This is been ongoing for slightly more than a year.  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  His wife and daughter have noted that he gets dyspneic, they noted this when they were walking around at [place] a few weeks ago.  He discussed this with the PCP about a year ago and underwent cardiac stress testing, which was reportedly negative. He reports an occasional dry cough more like throat clearing, medication review shows losartan. When he does exercise he develops a runny nose and some sneezing He does not have a regular exercise program outside of his activity at work  Underwent hip replacement  In his 78's , gained some weight and has been at a stable weight of 220 pounds since then  Walking around the office,  his heart rate went up from 83-92 oxygen saturation stayed stable at 98 to 99%  Smoked less than 5 pack years and quit in his early 31s. His mother was a heavy smoker and had COPD    Past Medical History:  Diagnosis Date  . Colon polyps    hyperplastic  . Diverticulosis   . Heart murmur    since he was a baby-echo done preop hip surg 2003-  . Hypertension    Past Surgical History:  Procedure Laterality Date  . COLONOSCOPY    . I&D EXTREMITY  12/26/2011   Procedure: IRRIGATION AND DEBRIDEMENT EXTREMITY;  Surgeon: Tennis Must, MD;  Location: [facility];  Service: Orthopedics;  Laterality: Left;  . KNEE ARTHROSCOPY Bilateral 1995, 1998   right and left  . MASS EXCISION  12/26/2011   Procedure: EXCISION MASS;  Surgeon: Tennis Must, MD;  Location: [place];  Service:  Orthopedics;  Laterality: Left;  LEFT SMALL EXCISION MASS & DEBRIDEMENT DIP JOINT  . POLYPECTOMY    . TOTAL HIP ARTHROPLASTY Left 2003    No Known Allergies  Social History   Socioeconomic History  . Marital status: Married    Spouse name: Not on file  . Number of children: 3  . Years of education: Not on file  . Highest education level: Not on file  Occupational History  . Occupation: Information systems manager: [employer]  . Financial resource strain: Not on file  . Food insecurity    Worry: Not on file    Inability: Not on file  . Transportation needs    Medical: Not on file    Non-medical: Not on file  Tobacco Use  . Smoking status: Never Smoker  . Smokeless tobacco: Never Used  Substance and Sexual Activity  . Alcohol use: Yes    Alcohol/week: 3.0 standard drinks    Types: 3 Cans of beer per week  . Drug use: No  . Sexual activity: Not on file  Lifestyle  . Physical activity    Days per week: Not on file    Minutes per session: Not on file  . Stress: Not on file  Relationships  . Social Herbalist on phone: Not  on file    Gets together: Not on file    Attends religious service: Not on file    Active member of club or organization: Not on file    Attends meetings of clubs or organizations: Not on file    Relationship status: Not on file  . Intimate partner violence    Fear of current or ex partner: Not on file    Emotionally abused: Not on file    Physically abused: Not on file    Forced sexual activity: Not on file  Other Topics Concern  . Not on file  Social History Narrative  . Not on file    Family History  Problem Relation Age of Onset  . Colon cancer Neg Hx   . Esophageal cancer Neg Hx   . Rectal cancer Neg Hx   . Stomach cancer Neg Hx      Review of Systems Constitutional: negative for anorexia, fevers and sweats  Eyes: negative for irritation, redness and visual disturbance  Ears, nose, mouth, throat, and  face: negative for earaches, epistaxis, nasal congestion and sore throat  Respiratory: negative for cough,  sputum and wheezing  Cardiovascular: negative for chest pain,  lower extremity edema, orthopnea, palpitations and syncope  Gastrointestinal: negative for abdominal pain, constipation, diarrhea, melena, nausea and vomiting  Genitourinary:negative for dysuria, frequency and hematuria  Hematologic/lymphatic: negative for bleeding, easy bruising and lymphadenopathy  Musculoskeletal:negative for arthralgias, muscle weakness and stiff joints  Neurological: negative for coordination problems, gait problems, headaches and weakness  Endocrine: negative for diabetic symptoms including polydipsia, polyuria and weight loss     Objective:   Physical Exam  Gen. Pleasant, obese, in no distress, normal affect ENT - no pallor,icterus, no post nasal drip, class 2-3 airway Neck: No JVD, no thyromegaly, no carotid bruits Lungs: no use of accessory muscles, no dullness to percussion, fine dry bibasilar rales no rhonchi  Cardiovascular: Rhythm regular, heart sounds  normal, no murmurs or gallops, no peripheral edema Abdomen: soft and non-tender, no hepatosplenomegaly, BS normal. Musculoskeletal: No deformities, no cyanosis or clubbing Neuro:  alert, non focal, no tremors       Assessment & Plan:

## 2018-12-03 ENCOUNTER — Other Ambulatory Visit: Payer: Self-pay

## 2018-12-03 ENCOUNTER — Ambulatory Visit (INDEPENDENT_AMBULATORY_CARE_PROVIDER_SITE_OTHER): Payer: 59 | Admitting: Internal Medicine

## 2018-12-03 ENCOUNTER — Encounter: Payer: Self-pay | Admitting: Internal Medicine

## 2018-12-03 VITALS — BP 124/84 | HR 84 | Temp 98.1°F | Ht 73.0 in | Wt 231.2 lb

## 2018-12-03 DIAGNOSIS — R195 Other fecal abnormalities: Secondary | ICD-10-CM

## 2018-12-03 DIAGNOSIS — R194 Change in bowel habit: Secondary | ICD-10-CM

## 2018-12-03 DIAGNOSIS — Z86010 Personal history of colonic polyps: Secondary | ICD-10-CM

## 2018-12-03 MED ORDER — NA SULFATE-K SULFATE-MG SULF 17.5-3.13-1.6 GM/177ML PO SOLN: 1.0000 | Freq: Once | ORAL | 0 refills | Status: AC

## 2018-12-03 NOTE — Progress Notes (Signed)
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 Reynaldo Minium regarding Hemoccult positive stool on routine annual evaluation.  The patient has a history of adenomatous colon polyps.  Index colonoscopy 2009 was small tubular adenoma.  Follow-up colonoscopy October 2014 with a diminutive hyperplastic polyp.  Incidental mild left-sided diverticulosis.  Follow-up in 10 years recommended.  Patient tells me that he has not seen blood.  No melena or hematochezia.  No rectal pain.  He has had a greater than 1 year history of nocturnal abdominal cramping associated with nausea and followed by diarrhea.  This keeps him up for several hours.  This occurs once or twice per month.  He cannot associate it with any particular dietary item.  He did take a course of probiotics which she feels helped.  Over time his symptoms have not progressed.  No weight loss.  He has had generalized borborygmi.  Review of outside blood work from September 2019 shows hemoglobin 16.3.  Review of outside blood work from August 2020 shows hemoglobin 15.0.  Normal MCV.  Normal liver test.  Hemoccult testing was indeed positive.  Relevant x-rays include CT scan of the abdomen and pelvis November 2015 which revealed no acute abnormalities.  Stable adrenal adenoma.  GI review of systems otherwise negative.  REVIEW OF SYSTEMS:  All non-GI ROS negative unless otherwise stated in the HPI except for heart murmur, shortness of breath, sleeping problems  Past Medical History:  Diagnosis Date  . Asthma   . Colon polyps    hyperplastic  . Diverticulosis   . Heart murmur    since he was a baby-echo done preop hip surg 2003-  . Hypertension   . Melanoma [facility])     Past Surgical History:  Procedure Laterality Date  . COLONOSCOPY    . I&D EXTREMITY  12/26/2011   Procedure: IRRIGATION AND DEBRIDEMENT EXTREMITY;  Surgeon: Tennis Must, MD;  Location: [place];  Service: Orthopedics;  Laterality: Left;  . KNEE ARTHROSCOPY Bilateral 1995, 1998   right and left  . MASS EXCISION  12/26/2011   Procedure: EXCISION MASS;  Surgeon: Tennis Must, MD;  Location: [place];  Service: Orthopedics;  Laterality: Left;  LEFT SMALL EXCISION MASS & DEBRIDEMENT DIP JOINT  . MELANOMA EXCISION Left 2013  . POLYPECTOMY    . TOTAL HIP ARTHROPLASTY Left 2003    Social History Mike Jenkins  reports that he has quit smoking. He has never used smokeless tobacco. He reports current alcohol use. He reports that he does not use drugs.  family history includes Colon polyps in his father.  No Known Allergies     PHYSICAL EXAMINATION: Vital signs: BP 124/84   Pulse 84   Temp 98.1 F (36.7 C)   Ht 6\' 1"  (1.854 m)   Wt 231 lb 3.2 oz (104.9 kg)   BMI 30.50 kg/m   Constitutional: generally well-appearing, no acute distress Psychiatric: alert and oriented x3, cooperative Eyes: extraocular movements intact, anicteric, conjunctiva pink Mouth: oral pharynx moist, no lesions Neck: supple no lymphadenopathy Cardiovascular: heart regular rate and rhythm. Lungs: clear to auscultation bilaterally Abdomen: soft, nontender, nondistended, no obvious ascites, no peritoneal signs, normal bowel sounds, no organomegaly Rectal: Deferred until colonoscopy Extremities: no clubbing, cyanosis, or lower extremity edema bilaterally Skin: no lesions on visible extremities Neuro: No focal deficits.  Cranial nerves intact  ASSESSMENT:  1.  Hemoccult positive stool.  Rule out underlying GI mucosal lesion.  Normal hemoglobin 2.  1+ year history of change in bowel habits as manifested by periodic nocturnal abdominal cramping with nausea followed by diarrhea.  Rule out microscopic colitis 3.  Personal history of adenomatous colon polyp 2009.  Negative surveillance exam 2014   PLAN:  1.  Colonoscopy with biopsies to evaluate Hemoccult-positive stool and change in  bowel habits.The nature of the procedure, as well as the risks, benefits, and alternatives were carefully and thoroughly reviewed with the patient. Ample time for discussion and questions allowed. The patient understood, was satisfied, and agreed to proceed. 2.  Additional recommendations regarding bowel habits change after the above completed.

## 2018-12-03 NOTE — Patient Instructions (Signed)
You have been scheduled for a colonoscopy. Please follow written instructions given to you at your visit today.  Please pick up your prep supplies at the pharmacy within the next 1-3 days. If you use inhalers (even only as needed), please bring them with you on the day of your procedure.  Due to recent COVID-19 restrictions implemented by [employer] and state authorities and in an effort to keep both patients and staff as safe as possible, [facility] requires COVID-19 testing prior to any scheduled endoscopic procedure. The testing center is located at [address] [place], [place] [address] in the [facility] [employer]  suite.  Your appointment has been scheduled for 10:00am on 01/12/2019.   Please bring your insurance cards to this appointment. You will require your COVID screen 2 business days prior to your endoscopic procedure.  You are not required to quarantine after your screening.  You will only receive a phone call with the results if it is POSITIVE.  If you do not receive a call the day before your procedure you should begin your prep, if ordered, and you should report to the endo center for your procedure at your designated appointment arrival time ( one hour prior to the procedure time). There is no cost to you for the screening on the day of the swab.  [facility] will file with your insurance company for the testing.    You may receive an automated phone call prior to your procedure or have a message in your MyChart that you have an appointment for a BP/15 at the [facility], please disregard this message.  Your testing will be at the [place] [place] location.   If you are leaving [facility] travel [place] on [address]. [address], turn left onto [address], turn night onto [address] at the 1st stop light turn right, pass the [employer] on  your right and proceed to [place] (white building).

## 2018-12-04 ENCOUNTER — Telehealth: Payer: Self-pay | Admitting: Internal Medicine

## 2018-12-04 NOTE — Telephone Encounter (Signed)
Hi Mike Jenkins, this pt changed his procedure again because it conflicted with another exam he has that date. He r/s to 11/6 at 4:00pm. Thank you.

## 2018-12-04 NOTE — Telephone Encounter (Signed)
Mike Jenkins- This patient called in to reschedule his colonoscopy- he wanted an earlier date. He is now scheduled for 11/02. Wanted to let you know so the COVID test could be RS.

## 2018-12-04 NOTE — Telephone Encounter (Signed)
Rescheduled Covid test to 12/16/2018 at 10:30am.  Spoke with patient and communicated the new covid testing date and time.

## 2018-12-14 ENCOUNTER — Encounter: Payer: 59 | Admitting: Internal Medicine

## 2018-12-14 ENCOUNTER — Other Ambulatory Visit: Payer: Self-pay

## 2018-12-14 ENCOUNTER — Ambulatory Visit (INDEPENDENT_AMBULATORY_CARE_PROVIDER_SITE_OTHER)
Admission: RE | Admit: 2018-12-14 | Discharge: 2018-12-14 | Disposition: A | Discharge status: Home / Self Care | Payer: 59 | Source: Ambulatory Visit | Attending: Pulmonary Disease | Admitting: Pulmonary Disease

## 2018-12-14 DIAGNOSIS — R0602 Shortness of breath: Secondary | ICD-10-CM

## 2018-12-16 ENCOUNTER — Encounter: Payer: Self-pay | Admitting: *Deleted

## 2018-12-16 ENCOUNTER — Encounter: Payer: Self-pay | Admitting: Internal Medicine

## 2018-12-16 ENCOUNTER — Other Ambulatory Visit: Payer: Self-pay | Admitting: Internal Medicine

## 2018-12-16 ENCOUNTER — Other Ambulatory Visit: Payer: 59

## 2018-12-16 LAB — SARS CORONAVIRUS 2 (TAT 6-24 HRS): SARS Coronavirus 2: NEGATIVE

## 2018-12-18 ENCOUNTER — Telehealth: Payer: Self-pay

## 2018-12-18 ENCOUNTER — Encounter: Payer: Self-pay | Admitting: Internal Medicine

## 2018-12-18 ENCOUNTER — Ambulatory Visit (AMBULATORY_SURGERY_CENTER): Payer: 59 | Admitting: Internal Medicine

## 2018-12-18 ENCOUNTER — Telehealth: Payer: Self-pay | Admitting: *Deleted

## 2018-12-18 ENCOUNTER — Other Ambulatory Visit: Payer: Self-pay

## 2018-12-18 VITALS — BP 118/75 | HR 61 | Temp 98.1°F | Resp 20 | Ht 73.0 in | Wt 231.0 lb

## 2018-12-18 DIAGNOSIS — Z86010 Personal history of colonic polyps: Secondary | ICD-10-CM

## 2018-12-18 DIAGNOSIS — D125 Benign neoplasm of sigmoid colon: Secondary | ICD-10-CM

## 2018-12-18 DIAGNOSIS — R197 Diarrhea, unspecified: Secondary | ICD-10-CM

## 2018-12-18 DIAGNOSIS — R194 Change in bowel habit: Secondary | ICD-10-CM

## 2018-12-18 DIAGNOSIS — R195 Other fecal abnormalities: Secondary | ICD-10-CM

## 2018-12-18 MED ORDER — SODIUM CHLORIDE 0.9 % IV SOLN: 500.0000 mL | Freq: Once | INTRAVENOUS | Status: DC

## 2018-12-18 NOTE — Progress Notes (Signed)
A/ox3, pleased with MAC, report to RN 

## 2018-12-18 NOTE — Telephone Encounter (Signed)
Pt called to report that he had some bright red blood in his stool after he went home this morning. No abdominal swelling or pain noted. Will let Dr. Henrene Pastor know and follow up with the patient ASAP. SM

## 2018-12-18 NOTE — Telephone Encounter (Signed)
Mike Jenkins, I spoke with the patient myself.  At this point the bleeding seems minor may be secondary to the distal biopsies that we obtained.  I did advise him that he could have significant bleeding from the polypectomy site of the large polyp.  I gave him parameters terms of observation versus presenting to the hospital for potential post polypectomy bleed.  He understood.  He will call if he has any additional questions orl problems.  Thanks

## 2018-12-18 NOTE — Op Note (Signed)
[place] Patient Name: Mike Jenkins Procedure Date: 12/18/2018 9:58 AM MRN: [id number] Endoscopist: Docia Chuck. Henrene Pastor , MD Age: 63 Referring MD:  Date of Birth: 06/02/1955 Gender: Male Account #: [id number] Procedure:                Colonoscopy with hot snare polypectomy x 1; with                            biopsies Indications:              Heme positive stool, Incidental change in bowel                            habits noted (episodic nocturnal abdominal cramping                            followed by diarrhea). Personal history of                            nonadvanced adenoma 2009. Follow-up 2014 with                            hyperplastic polyp Medicines:                Monitored Anesthesia Care Procedure:                Pre-Anesthesia Assessment:                           - Prior to the procedure, a History and Physical                            was performed, and patient medications and                            allergies were reviewed. The patient's tolerance of                            previous anesthesia was also reviewed. The risks                            and benefits of the procedure and the sedation                            options and risks were discussed with the patient.                            All questions were answered, and informed consent                            was obtained. Prior Anticoagulants: The patient has                            taken no previous anticoagulant or antiplatelet  agents. ASA Grade Assessment: II - A patient with                            mild systemic disease. After reviewing the risks                            and benefits, the patient was deemed in                            satisfactory condition to undergo the procedure.                           After obtaining informed consent, the colonoscope                            was passed under direct vision. Throughout the               procedure, the patient's blood pressure, pulse, and                            oxygen saturations were monitored continuously. The                            Colonoscope was introduced through the anus and                            advanced to the the cecum, identified by                            appendiceal orifice and ileocecal valve. The                            ileocecal valve, appendiceal orifice, and rectum                            were photographed. The quality of the bowel                            preparation was excellent. The colonoscopy was                            performed without difficulty. The patient tolerated                            the procedure well. The bowel preparation used was                            SUPREP via split dose instruction. Scope In: 10:02:22 AM Scope Out: 10:17:14 AM Scope Withdrawal Time: 0 hours 13 minutes 24 seconds  Total Procedure Duration: 0 hours 14 minutes 52 seconds  Findings:                 A 15 mm polyp was found in the sigmoid colon. The  polyp was pedunculated. The polyp was removed with                            a hot snare. Resection and retrieval were complete.                           Multiple small-mouthed diverticula were found in                            the sigmoid colon.                           The entire examined colon appeared otherwise normal                            on direct and retroflexion views. Biopsies for                            histology were taken with a cold forceps from the                            left colon for evaluation of microscopic colitis. Complications:            No immediate complications. Estimated blood loss:                            None. Estimated Blood Loss:     Estimated blood loss: none. Impression:               - One 15 mm polyp in the sigmoid colon, removed                            with a hot snare. Resected and retrieved.                            - Diverticulosis in the sigmoid colon.                           - The entire examined colon is otherwise normal on                            direct and retroflexion views. Recommendation:           - Repeat colonoscopy in 3 years for surveillance                            (large polyp).                           - Patient has a contact number available for                            emergencies. The signs and symptoms of potential  delayed complications were discussed with the                            patient. Return to normal activities tomorrow.                            Written discharge instructions were provided to the                            patient.                           - Resume previous diet.                           - Continue present medications.                           - Await pathology results. Docia Chuck. Henrene Pastor, MD 12/18/2018 10:24:13 AM This report has been signed electronically.

## 2018-12-18 NOTE — Telephone Encounter (Signed)
Spoke with Dr. Henrene Pastor who instructed the patient to go to the ER if he continues to have bleeding but if it subsides he should just watch his stools for the next several day. Left message for patient with this information and told him to call with questions or concerns.

## 2018-12-18 NOTE — Progress Notes (Signed)
Called to room to assist during endoscopic procedure.  Patient ID and intended procedure confirmed with present staff. Received instructions for my participation in the procedure from the performing physician.  

## 2018-12-18 NOTE — Telephone Encounter (Signed)
Sent his call to Holli Humbles, did not get to speak to patient, performing patient care.

## 2018-12-18 NOTE — Patient Instructions (Signed)
HANDOUTS GIVEN ; POLYPS AND DIVERTICULOSIS    YOU HAD AN ENDOSCOPIC PROCEDURE TODAY AT [facility]:   Refer to the procedure report that was given to you for any specific questions about what was found during the examination.  If the procedure report does not answer your questions, please call your gastroenterologist to clarify.  If you requested that your care partner not be given the details of your procedure findings, then the procedure report has been included in a sealed envelope for you to review at your convenience later.  YOU SHOULD EXPECT: Some feelings of bloating in the abdomen. Passage of more gas than usual.  Walking can help get rid of the air that was put into your GI tract during the procedure and reduce the bloating. If you had a lower endoscopy (such as a colonoscopy or flexible sigmoidoscopy) you may notice spotting of blood in your stool or on the toilet paper. If you underwent a bowel prep for your procedure, you may not have a normal bowel movement for a few days.  Please Note:  You might notice some irritation and congestion in your nose or some drainage.  This is from the oxygen used during your procedure.  There is no need for concern and it should clear up in a day or so.  SYMPTOMS TO REPORT IMMEDIATELY:   Following lower endoscopy (colonoscopy or flexible sigmoidoscopy):  Excessive amounts of blood in the stool  Significant tenderness or worsening of abdominal pains  Swelling of the abdomen that is new, acute  Fever of 100F or higher  For urgent or emergent issues, a gastroenterologist can be reached at any hour by calling [phone].   DIET:  We do recommend a small meal at first, but then you may proceed to your regular diet.  Drink plenty of fluids but you should avoid alcoholic beverages for 24 hours.  ACTIVITY:  You should plan to take it easy for the rest of today and you should NOT DRIVE or use heavy machinery until tomorrow (because of  the sedation medicines used during the test).    FOLLOW UP: Our staff will call the number listed on your records 48-72 hours following your procedure to check on you and address any questions or concerns that you may have regarding the information given to you following your procedure. If we do not reach you, we will leave a message.  We will attempt to reach you two times.  During this call, we will ask if you have developed any symptoms of COVID 19. If you develop any symptoms (ie: fever, flu-like symptoms, shortness of breath, cough etc.) before then, please call [phone].  If you test positive for Covid 19 in the 2 weeks post procedure, please call and report this information to [place].    If any biopsies were taken you will be contacted by phone or by letter within the next 1-3 weeks.  Please call us at [phone] if you have not heard about the biopsies in 3 weeks.    SIGNATURES/CONFIDENTIALITY: You and/or your care partner have signed paperwork which will be entered into your electronic medical record.  These signatures attest to the fact that that the information above on your After Visit Summary has been reviewed and is understood.  Full responsibility of the confidentiality of this discharge information lies with you and/or your care-partner.

## 2018-12-18 NOTE — Progress Notes (Signed)
[place]

## 2018-12-21 ENCOUNTER — Encounter: Payer: Self-pay | Admitting: Internal Medicine

## 2018-12-22 ENCOUNTER — Telehealth: Payer: Self-pay

## 2018-12-22 NOTE — Telephone Encounter (Signed)
  Follow up Call-  Call back number 12/18/2018  Post procedure Call Back phone  # [phone]  Permission to leave phone message Yes  Some recent data might be hidden     Patient questions:  Do you have a fever, pain , or abdominal swelling? No. Pain Score  0 *  Have you tolerated food without any problems? Yes.    Have you been able to return to your normal activities? Yes.    Do you have any questions about your discharge instructions: Diet   No. Medications  No. Follow up visit  No.  Do you have questions or concerns about your Care? No.  Actions: * If pain score is 4 or above: No action needed, pain <4.  1. Have you developed a fever since your procedure? no  2.   Have you had an respiratory symptoms (SOB or cough) since your procedure? no  3.   Have you tested positive for COVID 19 since your procedure? no  4.   Have you had any family members/close contacts diagnosed with the COVID 19 since your procedure?  no   If yes to any of these questions please route to Joylene John, RN and Alphonsa Gin, Therapist, sports.

## 2018-12-22 NOTE — Telephone Encounter (Signed)
Attempted to reach patient for post-procedure f/u call. No answer. Left message that we will make another attempt to reach him later today and for him to please not hesitate to call us if he has any questions/concerns regarding his care. 

## 2018-12-23 ENCOUNTER — Ambulatory Visit: Payer: 59 | Admitting: Cardiology

## 2019-01-14 ENCOUNTER — Encounter: Payer: 59 | Admitting: Internal Medicine

## 2019-03-17 NOTE — Progress Notes (Signed)
Virtual Visit via Telephone Note  I connected with Fayne Norrie on 03/18/19 at  9:00 AM EST by telephone and verified that I am speaking with the correct person using two identifiers.  Location: Patient: Home Provider: [facility] - [id number], [place], [place], [place] [address]   I discussed the limitations, risks, security and privacy concerns of performing an evaluation and management service by telephone and the availability of in person appointments. I also discussed with the patient that there may be a patient responsible charge related to this service. The patient expressed understanding and agreed to proceed.  Patient consented to consult via telephone: Yes People present and their role in pt care: Pt    History of Present Illness:  64 year old male former smoker initially consulted with our practice in October/2020 for evaluation of dyspnea on exertion  Past medical history: HTN, Anxiety  Smoking history: Former smoker Maintenance: None Patient of Dr. Elsworth Soho  Chief complaint: 53-month follow-up  64 year old male former smoker followed in our office for dyspnea on exertion.  Patient was last seen for consult in October/2020.  At that time it was suspected that he may have pulmonary fibrosis based off of clinical exam with basilar crackles, dyspnea on exertion as well as demographics.  High-resolution CT chest was ordered that did not show any form of interstitial lung disease, did show some postinfectious scarring on lungs.  Patient was recommended to proceed forward with a physical activity program to increase overall activity levels.  Patient does not have pulmonary function testing on file.  Patient reporting no new issues.  He reports his dyspnea on exertion is stable.  He has been working on increasing his activity levels but he knows that he probably could do more physical activity.  He feels much more comfortable regarding his breathing at this time  after the high-resolution CT chest was reassuring.   Observations/Objective:  12/14/2018-CT chest high-res-no evidence of fibrotic interstitial lung disease, minimal subsegmental bronchial bleeding and nodularity in the lateral lingula nonspecific and most consistent with sequelae of atypical infection  Social History   Tobacco Use  Smoking Status Former Smoker  Smokeless Tobacco Never Used   There is no immunization history for the selected administration types on file for this patient.  Assessment and Plan:  Dyspnea on exertion Stable high-resolution CT chest Patient feels that dyspnea is stable if not slightly improved Patient working on increasing physical activity Offered pulmonary function testing, patient declines at this time  Plan: Continue to work on increasing physical activity If you decide that you are interested in obtaining pulmonary function testing then we can work to coordinate that for you Follow-up with our office as needed   Follow Up Instructions:  Return if symptoms worsen or fail to improve, for Follow up with Dr. Elsworth Soho.   I discussed the assessment and treatment plan with the patient. The patient was provided an opportunity to ask questions and all were answered. The patient agreed with the plan and demonstrated an understanding of the instructions.   The patient was advised to call back or seek an in-person evaluation if the symptoms worsen or if the condition fails to improve as anticipated.  I provided 14 minutes of non-face-to-face time during this encounter.   Lauraine Rinne, NP

## 2019-03-18 ENCOUNTER — Other Ambulatory Visit: Payer: Self-pay

## 2019-03-18 ENCOUNTER — Ambulatory Visit: Payer: 59 | Admitting: Pulmonary Disease

## 2019-03-18 ENCOUNTER — Encounter: Payer: Self-pay | Admitting: Pulmonary Disease

## 2019-03-18 ENCOUNTER — Ambulatory Visit (INDEPENDENT_AMBULATORY_CARE_PROVIDER_SITE_OTHER): Payer: 59 | Admitting: Pulmonary Disease

## 2019-03-18 DIAGNOSIS — R0600 Dyspnea, unspecified: Secondary | ICD-10-CM | POA: Diagnosis not present

## 2019-03-18 DIAGNOSIS — R0609 Other forms of dyspnea: Secondary | ICD-10-CM

## 2019-03-18 NOTE — Assessment & Plan Note (Signed)
Stable high-resolution CT chest Patient feels that dyspnea is stable if not slightly improved Patient working on increasing physical activity Offered pulmonary function testing, patient declines at this time  Plan: Continue to work on increasing physical activity If you decide that you are interested in obtaining pulmonary function testing then we can work to coordinate that for you Follow-up with our office as needed

## 2019-03-18 NOTE — Patient Instructions (Signed)
You were seen today by Lauraine Rinne, NP  for:   1. Dyspnea on exertion  Continue to work on increasing your overall physical activity  Perform physical activities every day  Your goal should be performing at least 30 minutes of physical activity every day  Notify our office if your breathing is worsening or if you have additional concerns  If you become interested in obtaining pulmonary function testing please contact our office and we can work to get this coordinated for you   Follow Up:    Return if symptoms worsen or fail to improve, for Follow up with Dr. Elsworth Soho.   Please do your part to reduce the spread of COVID-19:      Reduce your risk of any infection  and COVID19 by using the similar precautions used for avoiding the common cold or flu:  Marland Kitchen Wash your hands often with soap and warm water for at least 20 seconds.  If soap and water are not readily available, use an alcohol-based hand sanitizer with at least 60% alcohol.  . If coughing or sneezing, cover your mouth and nose by coughing or sneezing into the elbow areas of your shirt or coat, into a tissue or into your sleeve (not your hands). Langley Gauss A MASK when in public  . Avoid shaking hands with others and consider head nods or verbal greetings only. . Avoid touching your eyes, nose, or mouth with unwashed hands.  . Avoid close contact with people who are sick. . Avoid places or events with large numbers of people in one location, like concerts or sporting events. . If you have some symptoms but not all symptoms, continue to monitor at home and seek medical attention if your symptoms worsen. . If you are having a medical emergency, call 911.   [place] / e-Visit: [email or web address]         [facility]: [facility]: [id number]                   [facility]:  [id number]     It is flu season:   >>> Best ways to protect herself from the flu: Receive the yearly flu vaccine, practice good hand hygiene washing with soap and also using hand sanitizer when available, eat a nutritious meals, get adequate rest, hydrate appropriately   Please contact the office if your symptoms worsen or you have concerns that you are not improving.   Thank you for choosing [facility] for your healthcare, and for allowing [place] to partner with you on your healthcare journey. I am thankful to be able to provide care to you today.   Wyn Quaker FNP-C

## 2019-09-20 ENCOUNTER — Other Ambulatory Visit (HOSPITAL_BASED_OUTPATIENT_CLINIC_OR_DEPARTMENT_OTHER): Payer: Self-pay | Admitting: Internal Medicine

## 2019-09-20 ENCOUNTER — Ambulatory Visit (INDEPENDENT_AMBULATORY_CARE_PROVIDER_SITE_OTHER): Payer: 59

## 2019-09-20 ENCOUNTER — Other Ambulatory Visit: Payer: Self-pay

## 2019-09-20 DIAGNOSIS — R591 Generalized enlarged lymph nodes: Secondary | ICD-10-CM | POA: Diagnosis not present

## 2019-09-21 ENCOUNTER — Other Ambulatory Visit (HOSPITAL_BASED_OUTPATIENT_CLINIC_OR_DEPARTMENT_OTHER): Payer: Self-pay | Admitting: Internal Medicine

## 2019-09-22 ENCOUNTER — Ambulatory Visit (HOSPITAL_COMMUNITY)
Admission: RE | Admit: 2019-09-22 | Discharge: 2019-09-22 | Disposition: A | Discharge status: Home / Self Care | Payer: 59 | Source: Ambulatory Visit | Attending: Internal Medicine | Admitting: Internal Medicine

## 2019-09-22 ENCOUNTER — Other Ambulatory Visit (HOSPITAL_COMMUNITY): Payer: Self-pay | Admitting: Internal Medicine

## 2019-09-22 ENCOUNTER — Other Ambulatory Visit: Payer: Self-pay

## 2019-09-22 DIAGNOSIS — R1031 Right lower quadrant pain: Secondary | ICD-10-CM

## 2019-09-22 DIAGNOSIS — R599 Enlarged lymph nodes, unspecified: Secondary | ICD-10-CM

## 2019-09-22 MED ORDER — GADOBUTROL 1 MMOL/ML IV SOLN
10.0000 mL | Freq: Once | INTRAVENOUS | Status: AC | PRN
  Administered 2019-09-22: 10 mL via INTRAVENOUS

## 2019-09-27 ENCOUNTER — Other Ambulatory Visit: Payer: Self-pay | Admitting: Otolaryngology

## 2019-09-27 DIAGNOSIS — J351 Hypertrophy of tonsils: Secondary | ICD-10-CM

## 2019-10-06 ENCOUNTER — Ambulatory Visit
Admission: RE | Admit: 2019-10-06 | Discharge: 2019-10-06 | Disposition: A | Discharge status: Home / Self Care | Payer: 59 | Source: Ambulatory Visit | Attending: Otolaryngology | Admitting: Otolaryngology

## 2019-10-06 ENCOUNTER — Other Ambulatory Visit: Payer: 59

## 2019-10-06 ENCOUNTER — Other Ambulatory Visit: Payer: Self-pay

## 2019-10-06 DIAGNOSIS — J351 Hypertrophy of tonsils: Secondary | ICD-10-CM

## 2019-10-06 MED ORDER — IOPAMIDOL (ISOVUE-300) INJECTION 61%
75.0000 mL | Freq: Once | INTRAVENOUS | Status: AC | PRN
  Administered 2019-10-06: 75 mL via INTRAVENOUS

## 2019-10-07 ENCOUNTER — Other Ambulatory Visit: Payer: 59

## 2019-10-12 ENCOUNTER — Other Ambulatory Visit: Payer: Self-pay | Admitting: Otolaryngology

## 2019-10-12 ENCOUNTER — Other Ambulatory Visit: Payer: 59

## 2019-10-29 ENCOUNTER — Other Ambulatory Visit: Payer: Self-pay

## 2019-10-29 ENCOUNTER — Encounter (HOSPITAL_BASED_OUTPATIENT_CLINIC_OR_DEPARTMENT_OTHER): Payer: Self-pay | Admitting: Otolaryngology

## 2019-11-01 ENCOUNTER — Ambulatory Visit: Payer: Self-pay | Admitting: Surgery

## 2019-11-01 ENCOUNTER — Other Ambulatory Visit (HOSPITAL_COMMUNITY)
Admission: RE | Admit: 2019-11-01 | Discharge: 2019-11-01 | Disposition: A | Discharge status: Home / Self Care | Payer: 59 | Source: Ambulatory Visit | Attending: Otolaryngology | Admitting: Otolaryngology

## 2019-11-01 ENCOUNTER — Encounter (HOSPITAL_BASED_OUTPATIENT_CLINIC_OR_DEPARTMENT_OTHER)
Admission: RE | Admit: 2019-11-01 | Discharge: 2019-11-01 | Disposition: A | Discharge status: Home / Self Care | Payer: 59 | Source: Ambulatory Visit | Attending: Otolaryngology | Admitting: Otolaryngology

## 2019-11-01 DIAGNOSIS — Z01812 Encounter for preprocedural laboratory examination: Secondary | ICD-10-CM | POA: Diagnosis present

## 2019-11-01 DIAGNOSIS — Z20822 Contact with and (suspected) exposure to covid-19: Secondary | ICD-10-CM | POA: Diagnosis not present

## 2019-11-01 NOTE — H&P (Signed)
Mike Jenkins Appointment: 11/01/2019 11:00 AM Location: [facility] Patient #: [id number] DOB: Jun 22, 1955 Married / Language: Mike Jenkins / Race: White Male  History of Present Illness Mike Moores A. Mckenlee Mangham MD; 11/01/2019 1:25 PM) Patient words: Patient sent for evaluation of right groin lymphadenopathy. He is undergoing magnetic resonance imaging as well as ultrasound which shows a 3.5 cm right groin lymph node that appears to be in the inguinal canal. Her 6 weeks has not gotten smaller nor has it responded to antibiotics. Patient denies any recent fever, chills, illness, or trauma to the right lower extremity. He denies weight loss or night sweats as well. He is a Mike Jenkins and his elbow words but he's had no recent exposure to tick so he knows of.  The patient is a 64 year old male.   Past Surgical History Darden Palmer, [place]; 11/01/2019 10:46 AM) Colon Polyp Removal - Colonoscopy Hip Surgery Left. Knee Surgery Bilateral. Vasectomy  Diagnostic Studies History Darden Palmer, [place]; 11/01/2019 10:46 AM) Colonoscopy within last year  Allergies Darden Palmer, RMA; 11/01/2019 10:46 AM) No Known Drug Allergies [11/01/2019]: Allergies Reconciled  Medication History Darden Palmer, [place]; 11/01/2019 10:48 AM) LORazepam (1MG  Tablet, Oral) Active. Losartan Potassium (50MG  Tablet, Oral) Active. Medications Reconciled  Other Problems Darden Palmer, [place]; 11/01/2019 10:46 AM) Heart murmur Melanoma     Review of Systems Lattie Haw Irwin RMA; 11/01/2019 10:46 AM) General Not Present- Appetite Loss, Chills, Fatigue, Fever, Night Sweats, Weight Gain and Weight Loss. Skin Not Present- Change in Wart/Mole, Dryness, Hives, Jaundice, New Lesions, Non-Healing Wounds, Rash and Ulcer. HEENT Not Present- Earache, Hearing Loss, Hoarseness, Nose Bleed, Oral Ulcers, Ringing in the Ears, Seasonal Allergies, Sinus Pain, Sore Throat, Visual Disturbances, Wears glasses/contact lenses and Yellow  Eyes. Respiratory Present- Snoring. Not Present- Bloody sputum, Chronic Cough, Difficulty Breathing and Wheezing. Breast Not Present- Breast Mass, Breast Pain, Nipple Discharge and Skin Changes. Cardiovascular Not Present- Chest Pain, Difficulty Breathing Lying Down, Leg Cramps, Palpitations, Rapid Heart Rate, Shortness of Breath and Swelling of Extremities. Gastrointestinal Not Present- Abdominal Pain, Bloating, Bloody Stool, Change in Bowel Habits, Chronic diarrhea, Constipation, Difficulty Swallowing, Excessive gas, Gets full quickly at meals, Hemorrhoids, Indigestion, Nausea, Rectal Pain and Vomiting. Male Genitourinary Present- Nocturia. Not Present- Blood in Urine, Change in Urinary Stream, Frequency, Impotence, Painful Urination, Urgency and Urine Leakage. Musculoskeletal Not Present- Back Pain, Joint Pain, Joint Stiffness, Muscle Pain, Muscle Weakness and Swelling of Extremities. Neurological Not Present- Decreased Memory, Fainting, Headaches, Numbness, Seizures, Tingling, Tremor, Trouble walking and Weakness. Psychiatric Not Present- Anxiety, Bipolar, Change in Sleep Pattern, Depression, Fearful and Frequent crying. Hematology Not Present- Blood Thinners, Easy Bruising, Excessive bleeding, Gland problems, HIV and Persistent Infections.  Vitals Lattie Haw Lattimer RMA; 11/01/2019 10:49 AM) 11/01/2019 10:48 AM Weight: 228.25 lb Height: 72in Body Surface Area: 2.25 m Body Mass Index: 30.96 kg/m  Temp.: 98.105F  Pulse: 68 (Regular)  P.OX: 99% (Room air) BP: 128/86(Sitting, Left Arm, Standard)        Physical Exam (Ryhanna Dunsmore A. Maydell Knoebel MD; 11/01/2019 1:26 PM)  General Mental Status-Alert. General Appearance-Consistent with stated age. Hydration-Well hydrated. Voice-Normal.  Head and Neck Head-normocephalic, atraumatic with no lesions or palpable masses. Trachea-midline. Thyroid Gland Characteristics - normal size and consistency.  Chest and Lung Exam Chest and  lung exam reveals -quiet, even and easy respiratory effort with no use of accessory muscles and on auscultation, normal breath sounds, no adventitious sounds and normal vocal resonance. Inspection Chest Wall - Normal. Back - normal.  Cardiovascular Cardiovascular examination reveals -normal heart  sounds, regular rate and rhythm with no murmurs and normal pedal pulses bilaterally.  Abdomen Inspection Inspection of the abdomen reveals - No Hernias. Skin - Scar - no surgical scars. Palpation/Percussion Palpation and Percussion of the abdomen reveal - Soft, Non Tender, No Rebound tenderness, No Rigidity (guarding) and No hepatosplenomegaly. Auscultation Auscultation of the abdomen reveals - Bowel sounds normal.  Neurologic Neurologic evaluation reveals -alert and oriented x 3 with no impairment of recent or remote memory. Mental Status-Normal.  Musculoskeletal Normal Exam - Left-Upper Extremity Strength Normal and Lower Extremity Strength Normal. Normal Exam - Right-Upper Extremity Strength Normal and Lower Extremity Strength Normal.  Lymphatic Head & Neck  General Head & Neck Lymphatics: Bilateral - Description - Normal. Axillary  General Axillary Region: Bilateral - Description - Normal. Tenderness - Non Tender. Femoral & Inguinal  Inguinal nodes: Right - Size - > 3.0 cm. Consistency - Firm. Mobility - Mobile.    Assessment & Plan (Mussa Groesbeck A. Hang Ammon MD; 11/01/2019 1:28 PM)  LYMPHADENOPATHY, INGUINAL (R59.0) Impression: right recommend excisional lymph node mapping Discussed differential diagnosis of lymphoma versus infectious versus reactive versus other. Risks and benefits of surgery discussed. Risk of bleeding, infection, lymphedema, persistent fluid collection, seroma, lymphocele, lymphedema, and the need for other surgeries and/or procedures discussed. Potential blood vessels, nerves, anesthesia risk cardiovascular events neurologic events all discussed with  patient. He understands excisional biopsy is necessary to define the disease process to properly treat him. Expectations of surgery given. Recovery time given.   total time 45 minutes for discussion of disease process surgery , complication, recovery and any alternatives  Current Plans The pathophysiology of skin & subcutaneous masses was discussed. Natural history risks without surgery were discussed. I recommended surgery to remove the mass. I explained the technique of removal with use of local anesthesia & possible need for more aggressive sedation/anesthesia for patient comfort.  Risks such as bleeding, infection, wound breakdown, heart attack, death, and other risks were discussed. I noted a good likelihood this will help address the problem. Possibility that this will not correct all symptoms was explained. Possibility of regrowth/recurrence of the mass was discussed. We will work to minimize complications. Questions were answered. The patient expresses understanding & wishes to proceed with surgery.  Pt Education - [facility] Free Text Education/Instructions: discussed with patient and provided information. Pt Education - [facility] - General recommendations Pt Education - [facility] General Post-op HCI

## 2019-11-02 LAB — SARS CORONAVIRUS 2 (TAT 6-24 HRS): SARS Coronavirus 2: NEGATIVE

## 2019-11-03 NOTE — Progress Notes (Signed)
EKG (11/01/19) and chart reviewed by Dr. Sabra Heck. Okay to continue with surgery as planned per Dr. Sabra Heck.

## 2019-11-04 ENCOUNTER — Ambulatory Visit (HOSPITAL_BASED_OUTPATIENT_CLINIC_OR_DEPARTMENT_OTHER): Payer: 59 | Admitting: Certified Registered Nurse Anesthetist

## 2019-11-04 ENCOUNTER — Other Ambulatory Visit: Payer: Self-pay

## 2019-11-04 ENCOUNTER — Ambulatory Visit (HOSPITAL_BASED_OUTPATIENT_CLINIC_OR_DEPARTMENT_OTHER)
Admission: RE | Admit: 2019-11-04 | Discharge: 2019-11-04 | Disposition: A | Discharge status: Home / Self Care | Payer: 59 | Attending: Otolaryngology | Admitting: Otolaryngology

## 2019-11-04 ENCOUNTER — Encounter (HOSPITAL_BASED_OUTPATIENT_CLINIC_OR_DEPARTMENT_OTHER)
Admission: RE | Disposition: A | Discharge status: Home / Self Care | Payer: Self-pay | Source: Home / Self Care | Attending: Otolaryngology

## 2019-11-04 ENCOUNTER — Encounter (HOSPITAL_BASED_OUTPATIENT_CLINIC_OR_DEPARTMENT_OTHER): Payer: Self-pay | Admitting: Otolaryngology

## 2019-11-04 DIAGNOSIS — J351 Hypertrophy of tonsils: Secondary | ICD-10-CM | POA: Insufficient documentation

## 2019-11-04 DIAGNOSIS — J358 Other chronic diseases of tonsils and adenoids: Secondary | ICD-10-CM | POA: Diagnosis present

## 2019-11-04 DIAGNOSIS — Z79899 Other long term (current) drug therapy: Secondary | ICD-10-CM | POA: Diagnosis not present

## 2019-11-04 DIAGNOSIS — J45909 Unspecified asthma, uncomplicated: Secondary | ICD-10-CM | POA: Diagnosis not present

## 2019-11-04 DIAGNOSIS — I10 Essential (primary) hypertension: Secondary | ICD-10-CM | POA: Insufficient documentation

## 2019-11-04 DIAGNOSIS — Z87891 Personal history of nicotine dependence: Secondary | ICD-10-CM | POA: Diagnosis not present

## 2019-11-04 DIAGNOSIS — Z85820 Personal history of malignant melanoma of skin: Secondary | ICD-10-CM | POA: Diagnosis not present

## 2019-11-04 HISTORY — PX: TONSILLECTOMY: SHX5217

## 2019-11-04 SURGERY — TONSILLECTOMY
Anesthesia: General | Site: Throat | Laterality: Right

## 2019-11-04 MED ORDER — LIDOCAINE 2% (20 MG/ML) 5 ML SYRINGE
INTRAMUSCULAR | Status: AC
  Filled 2019-11-04: qty 5

## 2019-11-04 MED ORDER — IBUPROFEN 100 MG/5ML PO SUSP
ORAL | Status: AC
  Filled 2019-11-04: qty 20

## 2019-11-04 MED ORDER — ONDANSETRON HCL 4 MG/2ML IJ SOLN
INTRAMUSCULAR | Status: AC
  Filled 2019-11-04: qty 2

## 2019-11-04 MED ORDER — HYDROCODONE-ACETAMINOPHEN 7.5-325 MG/15ML PO SOLN: 10.0000 mL | Freq: Four times a day (QID) | ORAL | 0 refills | Status: AC | PRN

## 2019-11-04 MED ORDER — HYDROMORPHONE HCL 1 MG/ML IJ SOLN
0.2500 mg | INTRAMUSCULAR | Status: DC | PRN
  Administered 2019-11-04 (×2): 0.5 mg via INTRAVENOUS

## 2019-11-04 MED ORDER — PHENOL 1.4 % MT LIQD: 1.0000 | OROMUCOSAL | Status: DC | PRN

## 2019-11-04 MED ORDER — FENTANYL CITRATE (PF) 100 MCG/2ML IJ SOLN
INTRAMUSCULAR | Status: AC
  Filled 2019-11-04: qty 2

## 2019-11-04 MED ORDER — DEXAMETHASONE SODIUM PHOSPHATE 10 MG/ML IJ SOLN
10.0000 mg | Freq: Once | INTRAMUSCULAR | Status: AC
  Administered 2019-11-04: 10 mg via INTRAVENOUS
  Filled 2019-11-04: qty 1

## 2019-11-04 MED ORDER — DEXTROSE IN LACTATED RINGERS 5 % IV SOLN: INTRAVENOUS | Status: DC

## 2019-11-04 MED ORDER — MIDAZOLAM HCL 2 MG/2ML IJ SOLN
INTRAMUSCULAR | Status: DC | PRN
  Administered 2019-11-04: 2 mg via INTRAVENOUS

## 2019-11-04 MED ORDER — PROPOFOL 10 MG/ML IV BOLUS
INTRAVENOUS | Status: DC | PRN
  Administered 2019-11-04: 200 mg via INTRAVENOUS
  Administered 2019-11-04: 50 mg via INTRAVENOUS

## 2019-11-04 MED ORDER — DEXAMETHASONE SODIUM PHOSPHATE 10 MG/ML IJ SOLN
INTRAMUSCULAR | Status: AC
  Filled 2019-11-04: qty 1

## 2019-11-04 MED ORDER — ONDANSETRON HCL 4 MG PO TABS: 4.0000 mg | ORAL_TABLET | ORAL | Status: DC | PRN

## 2019-11-04 MED ORDER — HYDROMORPHONE HCL 1 MG/ML IJ SOLN
INTRAMUSCULAR | Status: AC
  Filled 2019-11-04: qty 0.5

## 2019-11-04 MED ORDER — ACETAMINOPHEN 10 MG/ML IV SOLN
INTRAVENOUS | Status: AC
  Filled 2019-11-04: qty 100

## 2019-11-04 MED ORDER — LIDOCAINE 2% (20 MG/ML) 5 ML SYRINGE
INTRAMUSCULAR | Status: DC | PRN
  Administered 2019-11-04: 100 mg via INTRAVENOUS

## 2019-11-04 MED ORDER — CEFAZOLIN SODIUM-DEXTROSE 2-4 GM/100ML-% IV SOLN
INTRAVENOUS | Status: AC
  Filled 2019-11-04: qty 100

## 2019-11-04 MED ORDER — MIDAZOLAM HCL 2 MG/2ML IJ SOLN
INTRAMUSCULAR | Status: AC
  Filled 2019-11-04: qty 2

## 2019-11-04 MED ORDER — OXYCODONE HCL 5 MG PO TABS: 5.0000 mg | ORAL_TABLET | Freq: Once | ORAL | Status: DC | PRN

## 2019-11-04 MED ORDER — HYDROCODONE-ACETAMINOPHEN 7.5-325 MG/15ML PO SOLN
10.0000 mL | ORAL | Status: DC | PRN
  Administered 2019-11-04: 15 mL via ORAL
  Filled 2019-11-04: qty 15

## 2019-11-04 MED ORDER — MORPHINE SULFATE (PF) 4 MG/ML IV SOLN: 2.0000 mg | INTRAVENOUS | Status: DC | PRN

## 2019-11-04 MED ORDER — FENTANYL CITRATE (PF) 100 MCG/2ML IJ SOLN
INTRAMUSCULAR | Status: DC | PRN
Start: 2019-11-04 — End: 2019-11-04
  Administered 2019-11-04 (×2): 50 ug via INTRAVENOUS

## 2019-11-04 MED ORDER — LACTATED RINGERS IV SOLN: INTRAVENOUS | Status: DC

## 2019-11-04 MED ORDER — IBUPROFEN 100 MG/5ML PO SUSP
400.0000 mg | Freq: Four times a day (QID) | ORAL | Status: DC | PRN
  Administered 2019-11-04: 400 mg via ORAL

## 2019-11-04 MED ORDER — ONDANSETRON HCL 4 MG/2ML IJ SOLN
INTRAMUSCULAR | Status: DC | PRN
  Administered 2019-11-04: 4 mg via INTRAVENOUS

## 2019-11-04 MED ORDER — ONDANSETRON HCL 4 MG/2ML IJ SOLN: 4.0000 mg | INTRAMUSCULAR | Status: DC | PRN

## 2019-11-04 MED ORDER — DEXAMETHASONE SODIUM PHOSPHATE 10 MG/ML IJ SOLN
INTRAMUSCULAR | Status: DC | PRN
  Administered 2019-11-04: 10 mg via INTRAVENOUS

## 2019-11-04 MED ORDER — ACETAMINOPHEN 10 MG/ML IV SOLN
1000.0000 mg | Freq: Once | INTRAVENOUS | Status: DC | PRN
  Administered 2019-11-04: 1000 mg via INTRAVENOUS

## 2019-11-04 MED ORDER — CEFAZOLIN SODIUM-DEXTROSE 2-3 GM-%(50ML) IV SOLR
INTRAVENOUS | Status: DC | PRN
  Administered 2019-11-04: 2 g via INTRAVENOUS

## 2019-11-04 MED ORDER — PROMETHAZINE HCL 25 MG/ML IJ SOLN: 6.2500 mg | INTRAMUSCULAR | Status: DC | PRN

## 2019-11-04 MED ORDER — SUCCINYLCHOLINE CHLORIDE 200 MG/10ML IV SOSY
PREFILLED_SYRINGE | INTRAVENOUS | Status: DC | PRN
  Administered 2019-11-04: 100 mg via INTRAVENOUS

## 2019-11-04 MED ORDER — OXYCODONE HCL 5 MG/5ML PO SOLN: 5.0000 mg | Freq: Once | ORAL | Status: DC | PRN

## 2019-11-04 SURGICAL SUPPLY — 33 items
CANISTER SUCT 1200ML W/VALVE (MISCELLANEOUS) ×2 IMPLANT
CLEANER CAUTERY TIP 5X5 PAD (MISCELLANEOUS) IMPLANT
COAGULATOR SUCT SWTCH 10FR 6 (ELECTROSURGICAL) ×2 IMPLANT
COVER BACK TABLE 60X90IN (DRAPES) ×2 IMPLANT
COVER MAYO STAND STRL (DRAPES) ×2 IMPLANT
COVER WAND RF STERILE (DRAPES) IMPLANT
ELECT COATED BLADE 2.86 ST (ELECTRODE) ×2 IMPLANT
ELECT REM PT RETURN 9FT ADLT (ELECTROSURGICAL) ×2
ELECT REM PT RETURN 9FT PED (ELECTROSURGICAL)
ELECTRODE REM PT RETRN 9FT PED (ELECTROSURGICAL) IMPLANT
ELECTRODE REM PT RTRN 9FT ADLT (ELECTROSURGICAL) ×1 IMPLANT
GAUZE SPONGE 4X4 12PLY STRL LF (GAUZE/BANDAGES/DRESSINGS) ×2 IMPLANT
GLOVE BIOGEL M 7.0 STRL (GLOVE) ×2 IMPLANT
GLOVE BIOGEL PI IND STRL 7.0 (GLOVE) ×2 IMPLANT
GLOVE BIOGEL PI INDICATOR 7.0 (GLOVE) ×2
GLOVE ECLIPSE 6.5 STRL STRAW (GLOVE) ×4 IMPLANT
GOWN STRL REUS W/ TWL LRG LVL3 (GOWN DISPOSABLE) ×2 IMPLANT
GOWN STRL REUS W/TWL LRG LVL3 (GOWN DISPOSABLE) ×4
MARKER SKIN DUAL TIP RULER LAB (MISCELLANEOUS) IMPLANT
NS IRRIG 1000ML POUR BTL (IV SOLUTION) ×2 IMPLANT
PAD CLEANER CAUTERY TIP 5X5 (MISCELLANEOUS)
PENCIL SMOKE EVACUATOR (MISCELLANEOUS) ×2 IMPLANT
PIN SAFETY STERILE (MISCELLANEOUS) IMPLANT
SHEET MEDIUM DRAPE 40X70 STRL (DRAPES) ×2 IMPLANT
SOLUTION BUTLER CLEAR DIP (MISCELLANEOUS) ×2 IMPLANT
SPONGE TONSIL TAPE 1 RFD (DISPOSABLE) IMPLANT
SPONGE TONSIL TAPE 1.25 RFD (DISPOSABLE) ×2 IMPLANT
SYR BULB EAR ULCER 3OZ GRN STR (SYRINGE) ×2 IMPLANT
TOWEL GREEN STERILE FF (TOWEL DISPOSABLE) ×2 IMPLANT
TUBE CONNECTING 20X1/4 (TUBING) ×2 IMPLANT
TUBE SALEM SUMP 12R W/ARV (TUBING) IMPLANT
TUBE SALEM SUMP 16 FR W/ARV (TUBING) ×2 IMPLANT
YANKAUER SUCT BULB TIP NO VENT (SUCTIONS) ×2 IMPLANT

## 2019-11-04 NOTE — Anesthesia Preprocedure Evaluation (Addendum)
Anesthesia Evaluation  Patient identified by MRN, date of birth, ID band Patient awake    Reviewed: Allergy & Precautions, NPO status , Patient's Chart, lab work & pertinent test results  Airway Mallampati: III  TM Distance: >3 FB Neck ROM: Full    Dental no notable dental hx.    Pulmonary asthma , former smoker,    Pulmonary exam normal breath sounds clear to auscultation       Cardiovascular hypertension, Pt. on medications Normal cardiovascular exam Rhythm:Regular Rate:Normal     Neuro/Psych PSYCHIATRIC DISORDERS negative neurological ROS     GI/Hepatic negative GI ROS, Neg liver ROS,   Endo/Other  negative endocrine ROS  Renal/GU negative Renal ROS     Musculoskeletal negative musculoskeletal ROS (+)   Abdominal   Peds  Hematology negative hematology ROS (+)   Anesthesia Other Findings Right tonsil swelling  Reproductive/Obstetrics                            Anesthesia Physical Anesthesia Plan  ASA: II  Anesthesia Plan: General   Post-op Pain Management:    Induction: Intravenous  PONV Risk Score and Plan: 3 and Ondansetron, Dexamethasone, Midazolam and Treatment may vary due to age or medical condition  Airway Management Planned: Oral ETT  Additional Equipment:   Intra-op Plan:   Post-operative Plan: Extubation in OR  Informed Consent: I have reviewed the patients History and Physical, chart, labs and discussed the procedure including the risks, benefits and alternatives for the proposed anesthesia with the patient or authorized representative who has indicated his/her understanding and acceptance.     Dental advisory given  Plan Discussed with: CRNA  Anesthesia Plan Comments:        Anesthesia Quick Evaluation

## 2019-11-04 NOTE — Op Note (Signed)
Operative Note: Tonsillectomy  Patient: Mike Jenkins  Medical record number: [id number]  Date:11/04/2019  Pre-operative Indications: Right tonsil asymmetry  Postoperative Indications: Same  Surgical Procedure: Right tonsillectomy  Anesthesia: GET  Surgeon: Delsa Bern, M.D.  Complications: None  EBL: Minimal   Brief History: The patient is a 64 y.o. male with a history of recurrent acute tonsillitis and tonsillar hypertrophy. The patient has been on multiple courses of antibiotics for recurrent infection. Based on the patient's history and findings I recommended right tonsillectomy under general anesthesia, risks and benefits were discussed in detail with the patient and family. They understand and agree with our plan for surgery which is scheduled on elective basis at [facility].  Surgical Procedure: The patient is brought to the operating room on 11/04/2019 and placed in supine position on the operating table. General endotracheal anesthesia was established without difficulty. When the patient was adequately anesthetized, surgical timeout was performed and correct identification of the patient and the surgical procedure. The patient was positioned and prepped and draped in sterile fashion.  With the patient prepared for surgery a Phill Mutter mouth gag was inserted without difficulty, there were no loose or broken teeth and the hard soft palate were intact.  Right tonsillectomy was then performed, using Bovie cautery and dissecting in a subcapsular fashion the entire right tonsil was removed from superior pole to tongue base.  Tonsil tissue was sent to pathology for gross and microscopic evaluation.  The tonsillar fossa were gently abraded with dry tonsil sponge and several small areas of point hemorrhage were cauterized with suction cautery. The Crowe-Davis mouth gag was released and reapplied there is no active bleeding. Oral cavity and nasopharynx were irrigated with saline. An  orogastric tube was passed and stomach contents were aspirated. Mouthgag was removed, again no loose or broken teeth and no bleeding. Patient was awakened from anesthetic and extubated, then transferred from the operating room to the recovery room in stable condition. There were no complications and blood loss was minimal.   Delsa Bern, M.D. [facility] 11/04/2019

## 2019-11-04 NOTE — Anesthesia Postprocedure Evaluation (Signed)
Anesthesia Post Note  Patient: Mike Jenkins  Procedure(s) Performed: TONSILLECTOMY (Right Throat)     Patient location during evaluation: PACU Anesthesia Type: General Level of consciousness: awake Pain management: pain level controlled Vital Signs Assessment: post-procedure vital signs reviewed and stable Respiratory status: spontaneous breathing, nonlabored ventilation, respiratory function stable and patient connected to nasal cannula oxygen Cardiovascular status: blood pressure returned to baseline and stable Postop Assessment: no apparent nausea or vomiting Anesthetic complications: no   No complications documented.  Last Vitals:  Vitals:   11/04/19 1045 11/04/19 1200  BP: 132/76   Pulse: 62   Resp: 16   Temp: 36.4 C   SpO2: 92% 93%    Last Pain:  Vitals:   11/04/19 1200  TempSrc:   PainSc: Asleep                 [name] P Thanos [place]ineau

## 2019-11-04 NOTE — H&P (Signed)
Mike Jenkins is an 64 y.o. male.   Chief Complaint: Right tonsil enlargement HPI: Tonsil asymmetry and Lymph adenopathy  Past Medical History:  Diagnosis Date  . Asthma   . Colon polyps    hyperplastic  . Diverticulosis   . Heart murmur    since he was a baby-echo done preop hip surg 2003-  . Hypertension   . Melanoma [facility])     Past Surgical History:  Procedure Laterality Date  . COLONOSCOPY    . I & D EXTREMITY  12/26/2011   Procedure: IRRIGATION AND DEBRIDEMENT EXTREMITY;  Surgeon: Tennis Must, MD;  Location: [place];  Service: Orthopedics;  Laterality: Left;  . KNEE ARTHROSCOPY Bilateral 1995, 1998   right and left  . MASS EXCISION  12/26/2011   Procedure: EXCISION MASS;  Surgeon: Tennis Must, MD;  Location: [place];  Service: Orthopedics;  Laterality: Left;  LEFT SMALL EXCISION MASS & DEBRIDEMENT DIP JOINT  . MELANOMA EXCISION Left 2013  . POLYPECTOMY    . TOTAL HIP ARTHROPLASTY Left 2003    Family History  Problem Relation Age of Onset  . Colon polyps Father   . Colon cancer Neg Hx   . Esophageal cancer Neg Hx   . Rectal cancer Neg Hx   . Stomach cancer Neg Hx    Social History:  reports that he has quit smoking. He has never used smokeless tobacco. He reports current alcohol use. He reports that he does not use drugs.  Allergies: No Known Allergies  Medications Prior to Admission  Medication Sig Dispense Refill  . losartan (COZAAR) 50 MG tablet Take 50 mg by mouth daily. Taking 2 tablets daily for total of 100 mg.    . LORazepam (ATIVAN) 1 MG tablet Take 1 tablet by mouth at bedtime as needed.      No results found for this or any previous visit (from the past 48 hour(s)). No results found.  Review of Systems  Constitutional: Negative.   HENT: Positive for sore throat.   Respiratory: Negative.     Blood pressure (!) 150/92, pulse 70, temperature 97.7 F (36.5 C), temperature source Oral, resp. rate 16, height 6'  2" (1.88 m), weight 102.7 kg, SpO2 100 %. Physical Exam Constitutional:      Appearance: He is normal weight.  HENT:     Mouth/Throat:     Comments: Right tonsil enlargement Neurological:     Mental Status: He is alert.      Assessment/Plan Adm for Right tonsillectomy  Jerrell Belfast, MD 11/04/2019, 8:53 AM

## 2019-11-04 NOTE — Anesthesia Procedure Notes (Signed)
Procedure Name: Intubation Date/Time: 11/04/2019 9:05 AM Performed by: Genelle Bal, CRNA Pre-anesthesia Checklist: Patient identified, Emergency Drugs available, Suction available and Patient being monitored Patient Re-evaluated:Patient Re-evaluated prior to induction Oxygen Delivery Method: Circle system utilized Preoxygenation: Pre-oxygenation with 100% oxygen Induction Type: IV induction Ventilation: Mask ventilation without difficulty Laryngoscope Size: Miller and 2 Grade View: Grade I Tube type: Oral Tube size: 7.5 mm Number of attempts: 1 Airway Equipment and Method: Stylet and Oral airway Placement Confirmation: ETT inserted through vocal cords under direct vision,  positive ETCO2 and breath sounds checked- equal and bilateral Secured at: 23 cm Tube secured with: Tape Dental Injury: Teeth and Oropharynx as per pre-operative assessment

## 2019-11-04 NOTE — Transfer of Care (Signed)
Immediate Anesthesia Transfer of Care Note  Patient: Mike Jenkins  Procedure(s) Performed: TONSILLECTOMY (Right Throat)  Patient Location: PACU  Anesthesia Type:General  Level of Consciousness: awake, alert  and oriented  Airway & Oxygen Therapy: Patient Spontanous Breathing and Patient connected to face mask oxygen  Post-op Assessment: Report given to RN and Post -op Vital signs reviewed and stable  Post vital signs: Reviewed and stable  Last Vitals:  Vitals Value Taken Time  BP 142/90   Temp    Pulse 76 11/04/19 0943  Resp 8 11/04/19 0943  SpO2 98 % 11/04/19 0943  Vitals shown include unvalidated device data.  Last Pain:  Vitals:   11/04/19 0745  TempSrc: Oral  PainSc: 0-No pain         Complications: No complications documented.

## 2019-11-04 NOTE — Discharge Instructions (Signed)
  Post Anesthesia Home Care Instructions  Activity: Get plenty of rest for the remainder of the day. A responsible individual must stay with you for 24 hours following the procedure.  For the next 24 hours, DO NOT: -Drive a car -Paediatric nurse -Drink alcoholic beverages -Take any medication unless instructed by your physician -Make any legal decisions or sign important papers.  Meals: Start with liquid foods such as gelatin or soup. Progress to regular foods as tolerated. Avoid greasy, spicy, heavy foods. If nausea and/or vomiting occur, drink only clear liquids until the nausea and/or vomiting subsides. Call your physician if vomiting continues.  Special Instructions/Symptoms: Your throat may feel dry or sore from the anesthesia or the breathing tube placed in your throat during surgery. If this causes discomfort, gargle with warm salt water. The discomfort should disappear within 24 hours.  If you had a scopolamine patch placed behind your ear for the management of post- operative nausea and/or vomiting:  1. The medication in the patch is effective for 72 hours, after which it should be removed.  Wrap patch in a tissue and discard in the trash. Wash hands thoroughly with soap and water. 2. You may remove the patch earlier than 72 hours if you experience unpleasant side effects which may include dry mouth, dizziness or visual disturbances. 3. Avoid touching the patch. Wash your hands with soap and water after contact with the patch.    No Tylenol until 4:20pm.

## 2019-11-05 ENCOUNTER — Encounter (HOSPITAL_BASED_OUTPATIENT_CLINIC_OR_DEPARTMENT_OTHER): Payer: Self-pay | Admitting: Otolaryngology

## 2019-11-05 ENCOUNTER — Other Ambulatory Visit: Payer: Self-pay

## 2019-11-08 ENCOUNTER — Other Ambulatory Visit (HOSPITAL_COMMUNITY)
Admission: RE | Admit: 2019-11-08 | Discharge: 2019-11-08 | Disposition: A | Discharge status: Home / Self Care | Payer: 59 | Source: Ambulatory Visit | Attending: Surgery | Admitting: Surgery

## 2019-11-08 ENCOUNTER — Encounter (HOSPITAL_BASED_OUTPATIENT_CLINIC_OR_DEPARTMENT_OTHER)
Admission: RE | Admit: 2019-11-08 | Discharge: 2019-11-08 | Disposition: A | Discharge status: Home / Self Care | Payer: 59 | Source: Ambulatory Visit | Attending: Surgery | Admitting: Surgery

## 2019-11-08 DIAGNOSIS — Z20822 Contact with and (suspected) exposure to covid-19: Secondary | ICD-10-CM | POA: Insufficient documentation

## 2019-11-08 DIAGNOSIS — Z01812 Encounter for preprocedural laboratory examination: Secondary | ICD-10-CM | POA: Insufficient documentation

## 2019-11-08 LAB — CBC WITH DIFFERENTIAL/PLATELET
Abs Immature Granulocytes: 0.06 10*3/uL (ref 0.00–0.07)
Basophils Absolute: 0 10*3/uL (ref 0.0–0.1)
Basophils Relative: 1 %
Eosinophils Absolute: 0.1 10*3/uL (ref 0.0–0.5)
Eosinophils Relative: 1 %
HCT: 43.9 % (ref 39.0–52.0)
Hemoglobin: 14.9 g/dL (ref 13.0–17.0)
Immature Granulocytes: 1 %
Lymphocytes Relative: 25 %
Lymphs Abs: 2.1 10*3/uL (ref 0.7–4.0)
MCH: 31.7 pg (ref 26.0–34.0)
MCHC: 33.9 g/dL (ref 30.0–36.0)
MCV: 93.4 fL (ref 80.0–100.0)
Monocytes Absolute: 0.5 10*3/uL (ref 0.1–1.0)
Monocytes Relative: 6 %
Neutro Abs: 5.6 10*3/uL (ref 1.7–7.7)
Neutrophils Relative %: 66 %
Platelets: 182 10*3/uL (ref 150–400)
RBC: 4.7 MIL/uL (ref 4.22–5.81)
RDW: 12.1 % (ref 11.5–15.5)
WBC: 8.4 10*3/uL (ref 4.0–10.5)
nRBC: 0 % (ref 0.0–0.2)

## 2019-11-08 LAB — COMPREHENSIVE METABOLIC PANEL
ALT: 28 U/L (ref 0–44)
AST: 31 U/L (ref 15–41)
Albumin: 3.9 g/dL (ref 3.5–5.0)
Alkaline Phosphatase: 51 U/L (ref 38–126)
Anion gap: 10 (ref 5–15)
BUN: 13 mg/dL (ref 8–23)
CO2: 27 mmol/L (ref 22–32)
Calcium: 9.5 mg/dL (ref 8.9–10.3)
Chloride: 102 mmol/L (ref 98–111)
Creatinine, Ser: 1.12 mg/dL (ref 0.61–1.24)
GFR calc Af Amer: >60 mL/min (ref >60)
GFR calc non Af Amer: >60 mL/min (ref >60)
Glucose, Bld: 117 mg/dL — ABNORMAL HIGH (ref 70–99)
Potassium: 4.1 mmol/L (ref 3.5–5.1)
Sodium: 139 mmol/L (ref 135–145)
Total Bilirubin: 0.9 mg/dL (ref 0.3–1.2)
Total Protein: 6.8 g/dL (ref 6.5–8.1)

## 2019-11-08 LAB — SARS CORONAVIRUS 2 (TAT 6-24 HRS): SARS Coronavirus 2: NEGATIVE

## 2019-11-08 NOTE — Progress Notes (Signed)

## 2019-11-09 ENCOUNTER — Encounter (HOSPITAL_COMMUNITY): Admission: EM | Disposition: A | Discharge status: Home / Self Care | Payer: Self-pay | Source: Home / Self Care

## 2019-11-09 ENCOUNTER — Emergency Department (HOSPITAL_COMMUNITY): Payer: 59 | Admitting: Certified Registered"

## 2019-11-09 ENCOUNTER — Other Ambulatory Visit: Payer: Self-pay

## 2019-11-09 ENCOUNTER — Encounter (HOSPITAL_COMMUNITY): Payer: Self-pay | Admitting: Certified Registered"

## 2019-11-09 ENCOUNTER — Observation Stay (HOSPITAL_COMMUNITY)
Admission: EM | Admit: 2019-11-09 | Discharge: 2019-11-10 | Disposition: A | Discharge status: Home / Self Care | Payer: 59 | Attending: Otolaryngology | Admitting: Otolaryngology

## 2019-11-09 DIAGNOSIS — Z87891 Personal history of nicotine dependence: Secondary | ICD-10-CM | POA: Diagnosis not present

## 2019-11-09 DIAGNOSIS — I10 Essential (primary) hypertension: Secondary | ICD-10-CM | POA: Diagnosis not present

## 2019-11-09 DIAGNOSIS — Z9889 Other specified postprocedural states: Secondary | ICD-10-CM

## 2019-11-09 DIAGNOSIS — J45909 Unspecified asthma, uncomplicated: Secondary | ICD-10-CM | POA: Diagnosis not present

## 2019-11-09 DIAGNOSIS — Z9189 Other specified personal risk factors, not elsewhere classified: Secondary | ICD-10-CM

## 2019-11-09 DIAGNOSIS — Z86010 Personal history of colonic polyps: Secondary | ICD-10-CM | POA: Insufficient documentation

## 2019-11-09 DIAGNOSIS — Z96642 Presence of left artificial hip joint: Secondary | ICD-10-CM | POA: Insufficient documentation

## 2019-11-09 DIAGNOSIS — J95830 Postprocedural hemorrhage and hematoma of a respiratory system organ or structure following a respiratory system procedure: Secondary | ICD-10-CM | POA: Diagnosis present

## 2019-11-09 HISTORY — PX: TONSILLECTOMY: SHX5217

## 2019-11-09 HISTORY — PX: OTHER SURGICAL HISTORY: SHX169

## 2019-11-09 LAB — TYPE AND SCREEN
ABO/RH(D): A POS
Antibody Screen: NEGATIVE
Unit division: 0
Unit division: 0
Unit division: 0
Unit division: 0

## 2019-11-09 LAB — BPAM RBC
Blood Product Expiration Date: 202110292359
Blood Product Expiration Date: 202110292359
Blood Product Expiration Date: 202110292359
Blood Product Expiration Date: 202110302359
ISSUE DATE / TIME: 202109281201
ISSUE DATE / TIME: 202109281201
ISSUE DATE / TIME: 202109281201
ISSUE DATE / TIME: 202109281201
Unit Type and Rh: 5100
Unit Type and Rh: 5100
Unit Type and Rh: 5100
Unit Type and Rh: 5100

## 2019-11-09 LAB — CBC
HCT: 33.4 % — ABNORMAL LOW (ref 39.0–52.0)
Hemoglobin: 11.1 g/dL — ABNORMAL LOW (ref 13.0–17.0)
MCH: 30.8 pg (ref 26.0–34.0)
MCHC: 33.2 g/dL (ref 30.0–36.0)
MCV: 92.8 fL (ref 80.0–100.0)
Platelets: 170 10*3/uL (ref 150–400)
RBC: 3.6 MIL/uL — ABNORMAL LOW (ref 4.22–5.81)
RDW: 12.2 % (ref 11.5–15.5)
WBC: 10.9 10*3/uL — ABNORMAL HIGH (ref 4.0–10.5)
nRBC: 0 % (ref 0.0–0.2)

## 2019-11-09 LAB — POCT I-STAT, CHEM 8
BUN: 28 mg/dL — ABNORMAL HIGH (ref 8–23)
Calcium, Ion: 1.26 mmol/L (ref 1.15–1.40)
Chloride: 100 mmol/L (ref 98–111)
Creatinine, Ser: 1.2 mg/dL (ref 0.61–1.24)
Glucose, Bld: 209 mg/dL — ABNORMAL HIGH (ref 70–99)
HCT: 38 % — ABNORMAL LOW (ref 39.0–52.0)
Hemoglobin: 12.9 g/dL — ABNORMAL LOW (ref 13.0–17.0)
Potassium: 4.2 mmol/L (ref 3.5–5.1)
Sodium: 136 mmol/L (ref 135–145)
TCO2: 25 mmol/L (ref 22–32)

## 2019-11-09 LAB — ABO/RH: ABO/RH(D): A POS

## 2019-11-09 LAB — SURGICAL PATHOLOGY

## 2019-11-09 SURGERY — TONSILLECTOMY
Anesthesia: General | Site: Throat

## 2019-11-09 MED ORDER — MIDAZOLAM HCL 2 MG/2ML IJ SOLN
INTRAMUSCULAR | Status: AC
  Filled 2019-11-09: qty 2

## 2019-11-09 MED ORDER — AMISULPRIDE (ANTIEMETIC) 5 MG/2ML IV SOLN: 10.0000 mg | Freq: Once | INTRAVENOUS | Status: DC | PRN

## 2019-11-09 MED ORDER — LACTATED RINGERS IV SOLN: INTRAVENOUS | Status: DC | PRN

## 2019-11-09 MED ORDER — IBUPROFEN 100 MG/5ML PO SUSP: 400.0000 mg | Freq: Four times a day (QID) | ORAL | Status: DC | PRN

## 2019-11-09 MED ORDER — FENTANYL CITRATE (PF) 100 MCG/2ML IJ SOLN
INTRAMUSCULAR | Status: AC
Start: 2019-11-09 — End: 2019-11-10
  Filled 2019-11-09: qty 2

## 2019-11-09 MED ORDER — PROPOFOL 10 MG/ML IV BOLUS
INTRAVENOUS | Status: DC | PRN
  Administered 2019-11-09: 200 mg via INTRAVENOUS

## 2019-11-09 MED ORDER — FENTANYL CITRATE (PF) 100 MCG/2ML IJ SOLN
25.0000 ug | INTRAMUSCULAR | Status: DC | PRN
  Administered 2019-11-09: 50 ug via INTRAVENOUS
  Administered 2019-11-09 (×2): 25 ug via INTRAVENOUS

## 2019-11-09 MED ORDER — ALBUMIN HUMAN 5 % IV SOLN: INTRAVENOUS | Status: DC | PRN

## 2019-11-09 MED ORDER — ONDANSETRON HCL 4 MG PO TABS: 4.0000 mg | ORAL_TABLET | ORAL | Status: DC | PRN

## 2019-11-09 MED ORDER — SUCCINYLCHOLINE CHLORIDE 200 MG/10ML IV SOSY
PREFILLED_SYRINGE | INTRAVENOUS | Status: DC | PRN
  Administered 2019-11-09: 120 mg via INTRAVENOUS

## 2019-11-09 MED ORDER — ONDANSETRON HCL 4 MG/2ML IJ SOLN
4.0000 mg | INTRAMUSCULAR | Status: DC | PRN
  Administered 2019-11-09: 4 mg via INTRAVENOUS

## 2019-11-09 MED ORDER — BACITRACIN ZINC 500 UNIT/GM EX OINT: 1.0000 "application " | TOPICAL_OINTMENT | Freq: Three times a day (TID) | CUTANEOUS | Status: DC

## 2019-11-09 MED ORDER — FENTANYL CITRATE (PF) 250 MCG/5ML IJ SOLN
INTRAMUSCULAR | Status: AC
  Filled 2019-11-09: qty 5

## 2019-11-09 MED ORDER — PROPOFOL 10 MG/ML IV BOLUS
INTRAVENOUS | Status: AC
  Filled 2019-11-09: qty 20

## 2019-11-09 MED ORDER — 0.9 % SODIUM CHLORIDE (POUR BTL) OPTIME
TOPICAL | Status: DC | PRN
  Administered 2019-11-09: 1000 mL

## 2019-11-09 MED ORDER — HYDROCODONE-ACETAMINOPHEN 7.5-325 MG/15ML PO SOLN
10.0000 mL | ORAL | Status: DC | PRN
  Administered 2019-11-09 – 2019-11-10 (×5): 15 mL via ORAL
  Filled 2019-11-09 (×5): qty 15

## 2019-11-09 MED ORDER — FENTANYL CITRATE (PF) 100 MCG/2ML IJ SOLN
INTRAMUSCULAR | Status: DC | PRN
Start: 2019-11-09 — End: 2019-11-09
  Administered 2019-11-09: 100 ug via INTRAVENOUS
  Administered 2019-11-09: 50 ug via INTRAVENOUS

## 2019-11-09 MED ORDER — DIPHENHYDRAMINE HCL 50 MG/ML IJ SOLN
INTRAMUSCULAR | Status: DC | PRN
  Administered 2019-11-09: 12.5 mg via INTRAVENOUS

## 2019-11-09 MED ORDER — DEXAMETHASONE SODIUM PHOSPHATE 10 MG/ML IJ SOLN
10.0000 mg | Freq: Once | INTRAMUSCULAR | Status: AC
  Administered 2019-11-09: 10 mg via INTRAVENOUS
  Filled 2019-11-09: qty 1

## 2019-11-09 MED ORDER — OXYCODONE HCL 5 MG PO TABS: 5.0000 mg | ORAL_TABLET | Freq: Once | ORAL | Status: DC | PRN

## 2019-11-09 MED ORDER — OXYCODONE HCL 5 MG/5ML PO SOLN: 5.0000 mg | Freq: Once | ORAL | Status: DC | PRN

## 2019-11-09 MED ORDER — DEXTROSE IN LACTATED RINGERS 5 % IV SOLN: INTRAVENOUS | Status: DC

## 2019-11-09 MED ORDER — MIDAZOLAM HCL 5 MG/5ML IJ SOLN
INTRAMUSCULAR | Status: DC | PRN
  Administered 2019-11-09: 2 mg via INTRAVENOUS

## 2019-11-09 MED ORDER — ONDANSETRON HCL 4 MG/2ML IJ SOLN
INTRAMUSCULAR | Status: DC | PRN
  Administered 2019-11-09: 4 mg via INTRAVENOUS

## 2019-11-09 MED ORDER — LIDOCAINE 2% (20 MG/ML) 5 ML SYRINGE
INTRAMUSCULAR | Status: DC | PRN
  Administered 2019-11-09: 80 mg via INTRAVENOUS

## 2019-11-09 SURGICAL SUPPLY — 30 items
BLADE SURG 15 STRL LF DISP TIS (BLADE) IMPLANT
BLADE SURG 15 STRL SS (BLADE)
CATH ROBINSON RED A/P 12FR (CATHETERS) IMPLANT
CLEANER TIP ELECTROSURG 2X2 (MISCELLANEOUS) ×2 IMPLANT
COAGULATOR SUCT SWTCH 10FR 6 (ELECTROSURGICAL) ×2 IMPLANT
COVER WAND RF STERILE (DRAPES) IMPLANT
DRAPE HALF SHEET 40X57 (DRAPES) IMPLANT
ELECT COATED BLADE 2.86 ST (ELECTRODE) ×2 IMPLANT
ELECT REM PT RETURN 9FT ADLT (ELECTROSURGICAL)
ELECT REM PT RETURN 9FT PED (ELECTROSURGICAL)
ELECTRODE REM PT RETRN 9FT PED (ELECTROSURGICAL) IMPLANT
ELECTRODE REM PT RTRN 9FT ADLT (ELECTROSURGICAL) IMPLANT
GAUZE 4X4 16PLY RFD (DISPOSABLE) ×2 IMPLANT
GLOVE BIOGEL M 7.0 STRL (GLOVE) ×4 IMPLANT
GOWN STRL REUS W/ TWL LRG LVL3 (GOWN DISPOSABLE) ×2 IMPLANT
GOWN STRL REUS W/TWL LRG LVL3 (GOWN DISPOSABLE) ×4
KIT BASIN OR (CUSTOM PROCEDURE TRAY) ×2 IMPLANT
KIT TURNOVER KIT B (KITS) ×2 IMPLANT
NEEDLE HYPO 25GX1X1/2 BEV (NEEDLE) IMPLANT
NS IRRIG 1000ML POUR BTL (IV SOLUTION) ×2 IMPLANT
PACK SURGICAL SETUP 50X90 (CUSTOM PROCEDURE TRAY) ×2 IMPLANT
PAD ARMBOARD 7.5X6 YLW CONV (MISCELLANEOUS) ×4 IMPLANT
PENCIL SMOKE EVACUATOR (MISCELLANEOUS) IMPLANT
SPECIMEN JAR SMALL (MISCELLANEOUS) ×4 IMPLANT
SPONGE TONSIL TAPE 1 RFD (DISPOSABLE) ×2 IMPLANT
SYR BULB EAR ULCER 3OZ GRN STR (SYRINGE) ×2 IMPLANT
TOWEL GREEN STERILE FF (TOWEL DISPOSABLE) ×4 IMPLANT
TUBE SALEM SUMP 16 FR W/ARV (TUBING) ×2 IMPLANT
WATER STERILE IRR 1000ML POUR (IV SOLUTION) ×2 IMPLANT
YANKAUER SUCT BULB TIP NO VENT (SUCTIONS) ×2 IMPLANT

## 2019-11-09 NOTE — Anesthesia Postprocedure Evaluation (Signed)
Anesthesia Post Note  Patient: Mike Jenkins  Procedure(s) Performed: Tonsillar Cauterization (N/A Throat)     Patient location during evaluation: PACU Anesthesia Type: General Level of consciousness: awake and alert Pain management: pain level controlled Vital Signs Assessment: post-procedure vital signs reviewed and stable Respiratory status: spontaneous breathing, nonlabored ventilation and respiratory function stable Cardiovascular status: blood pressure returned to baseline, stable and bradycardic Postop Assessment: no apparent nausea or vomiting Anesthetic complications: no   No complications documented.  Last Vitals:  Vitals:   11/09/19 1334 11/09/19 1349  BP: 110/63 127/81  Pulse: (!) 56 (!) 54  Resp: (!) 9 10  Temp:    SpO2: 99% 100%    Last Pain:  Vitals:                 Audry Pili

## 2019-11-09 NOTE — Op Note (Signed)
Operative Note: Post-Tonsillectomy Hemorrhage  Patient: [place] record number: [id number]  Date:11/09/2019  Pre-operative Indications: Acute post tonsillectomy hemorrhage  Postoperative Indications: Same  Surgical Procedure: Exam and Control of Post-tonsillectomy Hemorrhage  Anesthesia: GET  Surgeon: Delsa Bern, M.D.  Complications: None  EBL: 450 cc   Brief History: The patient is a 64 y.o. male with a history of recurrent acute tonsillitis and tonsillar hypertrophy.  They underwent right tonsillectomy for asymmetric tonsil mass on 11/04/2019.  The patient has had episodes of acute bleeding since the surgical procedure and presents today for evaluation and management.  Given the nature of recurrent bleeding I recommended examination under anesthesia with cautery of post tonsillectomy hemorrhage.  Risks and benefits of the procedure were discussed in detail with the patient and their family.     Surgical Procedure: The patient is brought to the operating room on 11/09/2019 and placed in supine position on the operating table. General endotracheal anesthesia was established without difficulty. When the patient was adequately anesthetized, surgical timeout was performed with correct identification of the patient and the surgical procedure. The patient was positioned and prepped and draped in sterile fashion.  With the patient prepared for surgery, a Phill Mutter mouth gag was inserted without difficulty, there were no loose or broken teeth.  The patient had active acute bleeding from the right tonsillar fossa.  This was controlled with monopolar suction cautery.  The tonsillar fossa were then gently abraded and no other significant bleeding sites were noted.  Crowe-Davis mouth gag was released and reapplied.  Patient's oral cavity and oropharynx were thoroughly irrigated with saline.  An orogastric tube was passed and the stomach contents including swallowed blood were  fully aspirated.  The patient was awakened from their anesthesia and extubated without difficulty.  Patient transferred from the operating room to the recovery room in stable condition.  No complications.  Estimated blood loss 450 cc.   Delsa Bern, M.D. [facility] 11/09/2019

## 2019-11-09 NOTE — Anesthesia Preprocedure Evaluation (Signed)
Anesthesia Evaluation  Patient identified by MRN, date of birth, ID band Patient awake    Reviewed: Allergy & Precautions, NPO status , Patient's Chart, lab work & pertinent test results  History of Anesthesia Complications Negative for: history of anesthetic complications  Airway   TM Distance: >3 FB Neck ROM: Full   Comment:  Limited airway exam due to active tonsillar bleeding Dental  (+) Dental Advisory Given   Pulmonary asthma , former smoker,    breath sounds clear to auscultation       Cardiovascular hypertension,  Rhythm:Regular Rate:Tachycardia     Neuro/Psych negative neurological ROS  negative psych ROS   GI/Hepatic negative GI ROS, Neg liver ROS,   Endo/Other  negative endocrine ROS  Renal/GU negative Renal ROS     Musculoskeletal negative musculoskeletal ROS (+)   Abdominal   Peds  Hematology negative hematology ROS (+)   Anesthesia Other Findings Tonsillar bleed Covid test negative   Reproductive/Obstetrics                             Anesthesia Physical Anesthesia Plan  ASA: II and emergent  Anesthesia Plan: General   Post-op Pain Management:    Induction: Intravenous and Rapid sequence  PONV Risk Score and Plan: 2 and Treatment may vary due to age or medical condition, Ondansetron, Dexamethasone and Midazolam  Airway Management Planned: Oral ETT  Additional Equipment: None  Intra-op Plan:   Post-operative Plan: Extubation in OR  Informed Consent: I have reviewed the patients History and Physical, chart, labs and discussed the procedure including the risks, benefits and alternatives for the proposed anesthesia with the patient or authorized representative who has indicated his/her understanding and acceptance.     Dental advisory given  Plan Discussed with: CRNA and Anesthesiologist  Anesthesia Plan Comments:         Anesthesia Quick  Evaluation

## 2019-11-09 NOTE — ED Notes (Signed)
Direct admit to OR

## 2019-11-09 NOTE — Progress Notes (Signed)
Called Dr. Fransisco Beau.  He is on the way to see patient.

## 2019-11-09 NOTE — Anesthesia Procedure Notes (Signed)
Procedure Name: Intubation Date/Time: 11/09/2019 12:09 PM Performed by: Moshe Salisbury, CRNA Pre-anesthesia Checklist: Patient identified, Emergency Drugs available, Suction available and Patient being monitored Patient Re-evaluated:Patient Re-evaluated prior to induction Oxygen Delivery Method: Circle System Utilized Preoxygenation: Pre-oxygenation with 100% oxygen Induction Type: IV induction, Rapid sequence and Cricoid Pressure applied Laryngoscope Size: Mac and 4 Grade View: Grade I Tube type: Oral Tube size: 7.5 mm Number of attempts: 1 Airway Equipment and Method: Stylet Placement Confirmation: ETT inserted through vocal cords under direct vision,  positive ETCO2 and breath sounds checked- equal and bilateral Secured at: 23 cm Tube secured with: Tape Dental Injury: Teeth and Oropharynx as per pre-operative assessment

## 2019-11-09 NOTE — H&P (Signed)
Mike Jenkins is an 64 y.o. male.   Chief Complaint: Hemorrhage right tonsil fossa HPI: Post op tonsil bleeding  Past Medical History:  Diagnosis Date  . Asthma   . Colon polyps    hyperplastic  . Diverticulosis   . Heart murmur    since he was a baby-echo done preop hip surg 2003-  . Hypertension   . Lymphadenopathy, inguinal    right  . Melanoma [facility])     Past Surgical History:  Procedure Laterality Date  . COLONOSCOPY    . I & D EXTREMITY  12/26/2011   Procedure: IRRIGATION AND DEBRIDEMENT EXTREMITY;  Surgeon: Tennis Must, MD;  Location: [place];  Service: Orthopedics;  Laterality: Left;  . KNEE ARTHROSCOPY Bilateral 1995, 1998   right and left  . MASS EXCISION  12/26/2011   Procedure: EXCISION MASS;  Surgeon: Tennis Must, MD;  Location: [place];  Service: Orthopedics;  Laterality: Left;  LEFT SMALL EXCISION MASS & DEBRIDEMENT DIP JOINT  . MELANOMA EXCISION Left 2013  . POLYPECTOMY    . TONSILLECTOMY Right 11/04/2019   Procedure: TONSILLECTOMY;  Surgeon: Jerrell Belfast, MD;  Location: [place];  Service: ENT;  Laterality: Right;  . TOTAL HIP ARTHROPLASTY Left 2003    Family History  Problem Relation Age of Onset  . Colon polyps Father   . Colon cancer Neg Hx   . Esophageal cancer Neg Hx   . Rectal cancer Neg Hx   . Stomach cancer Neg Hx    Social History:  reports that he has quit smoking. He has never used smokeless tobacco. He reports current alcohol use. He reports that he does not use drugs.  Allergies: No Known Allergies  Medications Prior to Admission  Medication Sig Dispense Refill  . HYDROcodone-acetaminophen (HYCET) 7.5-325 mg/15 ml solution Take 10-15 mLs by mouth every 6 (six) hours as needed for up to 7 days for severe pain. Alternate OTC Motrin 600mg  and Tylenol 650mg  every 6 hours as needed for pain. May substitute Norco (Tylenol/narcotic) for plain Tylenol as needed. 300 mL 0  .  HYDROcodone-acetaminophen (HYCET) 7.5-325 mg/15 ml solution Take 10-15 mLs by mouth every 6 (six) hours as needed for up to 7 days for severe pain. Alternate OTC Motrin 600mg  and Tylenol 650mg  every 6 hours as needed for pain. May substitute Norco (Tylenol/narcotic) for plain Tylenol as needed. 300 mL 0  . LORazepam (ATIVAN) 1 MG tablet Take 1 tablet by mouth at bedtime as needed.    Marland Kitchen losartan (COZAAR) 50 MG tablet Take 50 mg by mouth daily. Taking 2 tablets daily for total of 100 mg.      Results for orders placed or performed during the hospital encounter of 11/11/19 (from the past 48 hour(s))  CBC WITH DIFFERENTIAL     Status: None   Collection Time: 11/08/19 12:00 PM  Result Value Ref Range   WBC 8.4 4.0 - 10.5 K/uL   RBC 4.70 4.22 - 5.81 MIL/uL   Hemoglobin 14.9 13.0 - 17.0 g/dL   HCT 43.9 39 - 52 %   MCV 93.4 80.0 - 100.0 fL   MCH 31.7 26.0 - 34.0 pg   MCHC 33.9 30.0 - 36.0 g/dL   RDW 12.1 11.5 - 15.5 %   Platelets 182 150 - 400 K/uL   nRBC 0.0 0.0 - 0.2 %   Neutrophils Relative % 66 %   Neutro Abs 5.6 1.7 - 7.7 K/uL   Lymphocytes  Relative 25 %   Lymphs Abs 2.1 0.7 - 4.0 K/uL   Monocytes Relative 6 %   Monocytes Absolute 0.5 0 - 1 K/uL   Eosinophils Relative 1 %   Eosinophils Absolute 0.1 0 - 0 K/uL   Basophils Relative 1 %   Basophils Absolute 0.0 0 - 0 K/uL   Immature Granulocytes 1 %   Abs Immature Granulocytes 0.06 0.00 - 0.07 K/uL    Comment: Performed at [facility], [place] [address] [place], [place] [address]  Comprehensive metabolic panel     Status: Abnormal   Collection Time: 11/08/19 12:00 PM  Result Value Ref Range   Sodium 139 135 - 145 mmol/L   Potassium 4.1 3.5 - 5.1 mmol/L   Chloride 102 98 - 111 mmol/L   CO2 27 22 - 32 mmol/L   Glucose, Bld 117 (H) 70 - 99 mg/dL    Comment: Glucose reference range applies only to samples taken after fasting for at least 8 hours.   BUN 13 8 - 23 mg/dL   Creatinine, Ser 1.12 0.61 - 1.24 mg/dL   Calcium 9.5 8.9 -  10.3 mg/dL   Total Protein 6.8 6.5 - 8.1 g/dL   Albumin 3.9 3.5 - 5.0 g/dL   AST 31 15 - 41 U/L   ALT 28 0 - 44 U/L   Alkaline Phosphatase 51 38 - 126 U/L   Total Bilirubin 0.9 0.3 - 1.2 mg/dL   GFR calc non Af Amer >60 >60 mL/min   GFR calc Af Amer >60 >60 mL/min   Anion gap 10 5 - 15    Comment: Performed at [facility], [place] [address], [place],  [address]   No results found.  Review of Systems  Constitutional: Negative.   HENT: Positive for sore throat.     There were no vitals taken for this visit. Physical Exam Constitutional:      Appearance: He is normal weight.  HENT:     Mouth/Throat:     Comments: Hemorrhage right tonsil fossa Cardiovascular:     Rate and Rhythm: Normal rate.  Pulmonary:     Effort: Pulmonary effort is normal.  Neurological:     Mental Status: He is alert.      Assessment/Plan Adm for control of Hemorrhage right tonsil fossa after tonsillectomy 9/23  Jerrell Belfast, MD 11/09/2019, 12:00 PM

## 2019-11-09 NOTE — Transfer of Care (Signed)
Immediate Anesthesia Transfer of Care Note  Patient: Mike Jenkins  Procedure(s) Performed: Tonsillar Cauterization (N/A Throat)  Patient Location: PACU  Anesthesia Type:General  Level of Consciousness: awake and patient cooperative  Airway & Oxygen Therapy: Patient Spontanous Breathing and Patient connected to face mask oxygen  Post-op Assessment: Report given to RN, Post -op Vital signs reviewed and stable and Patient moving all extremities  Post vital signs: Reviewed and stable  Last Vitals:  Vitals Value Taken Time  BP 162/84 11/09/19 1250  Temp    Pulse 79 11/09/19 1252  Resp 17 11/09/19 1252  SpO2 96 % 11/09/19 1252  Vitals shown include unvalidated device data.  Last Pain: There were no vitals filed for this visit.       Complications: No complications documented.

## 2019-11-09 NOTE — Progress Notes (Signed)
Subjective: No bleeding.  Objective: Vital signs in last 24 hours: Temp:  [97.7 F (36.5 C)-98.6 F (37 C)] 97.7 F (36.5 C) (09/28 1450) Pulse Rate:  [53-73] 65 (09/28 1450) Resp:  [9-18] 13 (09/28 1430) BP: (110-162)/(63-84) 144/82 (09/28 1450) SpO2:  [90 %-100 %] 97 % (09/28 1450) Wt Readings from Last 1 Encounters:  11/04/19 102.7 kg    Intake/Output from previous day: No intake/output data recorded. Intake/Output this shift: Total I/O In: 1020 [P.O.:120; I.V.:250; Other:150; IV Piggyback:500] Out: 450 [Blood:450]  General appearance: alert, cooperative and no distress Throat: no bleeding  Recent Labs    11/08/19 1200 11/08/19 1200 11/09/19 1219 11/09/19 1551  WBC 8.4  --   --  10.9*  HGB 14.9   < > 12.9* 11.1*  HCT 43.9   < > 38.0* 33.4*  PLT 182  --   --  170   < > = values in this interval not displayed.    Recent Labs    11/08/19 1200 11/09/19 1219  NA 139 136  K 4.1 4.2  CL 102 100  CO2 27  --   GLUCOSE 117* 209*  BUN 13 28*  CREATININE 1.12 1.20  CALCIUM 9.5  --     Medications: I have reviewed the patient's current medications.  Assessment/Plan: Post-tonsillectomy hemorrhage s/p cautery  Doing well.   Plan overnight observation.   LOS: 0 days   Melida Quitter 11/09/2019, 6:13 PM

## 2019-11-10 ENCOUNTER — Encounter (HOSPITAL_COMMUNITY): Payer: Self-pay | Admitting: Otolaryngology

## 2019-11-10 DIAGNOSIS — J95830 Postprocedural hemorrhage and hematoma of a respiratory system organ or structure following a respiratory system procedure: Secondary | ICD-10-CM | POA: Diagnosis not present

## 2019-11-10 NOTE — Progress Notes (Addendum)
Surgery to be rescheduled per Dr. Eligha Bridegroom, anesthesiologist, d/t to tonsil hemorrhage and need for emergent surgery and 4 units of blood 11/09/19.

## 2019-11-10 NOTE — Discharge Summary (Signed)
Physician Discharge Summary  Patient ID: Mike Jenkins MRN: [id number] DOB/AGE: [birth year] 64 y.o.  Admit date: 11/09/2019 Discharge date: 11/10/2019  Admission Diagnoses:  Principal Problem:   Post-tonsillectomy hemorrhage Active Problems:   At risk for bleeding associated with tonsillectomy and adenoidectomy   Discharge Diagnoses:  Same  Surgeries: Procedure(s): Tonsillar Cauterization on 11/09/2019   Consultants: None  Discharged Condition: Improved  Hospital Course: Mike Jenkins is an 64 y.o. male who was admitted 11/09/2019 with a diagnosis of Principal Problem:   Post-tonsillectomy hemorrhage Active Problems:   At risk for bleeding associated with tonsillectomy and adenoidectomy  and went to the operating room on 11/09/2019 and underwent the above named procedures.   Patient stable on the first postoperative day, no further bleeding.  Patient discharged home.  Recent vital signs:  Vitals:   11/10/19 0113 11/10/19 0503  BP: 110/81 108/63  Pulse: 74 78  Resp: 18 18  Temp: 98.3 F (36.8 C) 98.3 F (36.8 C)  SpO2: 97% 97%    Recent laboratory studies:  Results for orders placed or performed during the hospital encounter of 11/09/19  CBC  Result Value Ref Range   WBC 10.9 (H) 4.0 - 10.5 K/uL   RBC 3.60 (L) 4.22 - 5.81 MIL/uL   Hemoglobin 11.1 (L) 13.0 - 17.0 g/dL   HCT 33.4 (L) 39 - 52 %   MCV 92.8 80.0 - 100.0 fL   MCH 30.8 26.0 - 34.0 pg   MCHC 33.2 30.0 - 36.0 g/dL   RDW 12.2 11.5 - 15.5 %   Platelets 170 150 - 400 K/uL   nRBC 0.0 0.0 - 0.2 %  I-STAT, chem 8  Result Value Ref Range   Sodium 136 135 - 145 mmol/L   Potassium 4.2 3.5 - 5.1 mmol/L   Chloride 100 98 - 111 mmol/L   BUN 28 (H) 8 - 23 mg/dL   Creatinine, Ser 1.20 0.61 - 1.24 mg/dL   Glucose, Bld 209 (H) 70 - 99 mg/dL   Calcium, Ion 1.26 1.15 - 1.40 mmol/L   TCO2 25 22 - 32 mmol/L   Hemoglobin 12.9 (L) 13.0 - 17.0 g/dL   HCT 38.0 (L) 39 - 52 %  Type and screen Ordered by PROVIDER DEFAULT   Result Value Ref Range   ABO/RH(D) A POS    Antibody Screen NEG    Sample Expiration      11/12/2019,2359 Performed at [facility], [address], [place], [place] [address]    Unit Number [id number]    Blood Component Type RED CELLS,LR    Unit division 00    Status of Unit REL FROM [facility]    Unit tag comment EMERGENCY RELEASE    Transfusion Status OK TO TRANSFUSE    Crossmatch Result NOT NEEDED    Unit Number [id number]    Blood Component Type RED CELLS,LR    Unit division 00    Status of Unit REL FROM [facility]    Unit tag comment EMERGENCY RELEASE    Transfusion Status OK TO TRANSFUSE    Crossmatch Result NOT NEEDED    Unit Number [id number]    Blood Component Type RED CELLS,LR    Unit division 00    Status of Unit REL FROM [facility]    Unit tag comment EMERGENCY RELEASE    Transfusion Status OK TO TRANSFUSE    Crossmatch Result NOT NEEDED    Unit Number [id number]    Blood Component Type RED CELLS,LR  Unit division 00    Status of Unit REL FROM [facility]    Unit tag comment EMERGENCY RELEASE    Transfusion Status OK TO TRANSFUSE    Crossmatch Result NOT NEEDED   ABO/Rh  Result Value Ref Range   ABO/RH(D)      A POS Performed at [facility], [address], [place], [place] [address]   BPAM [facility]  Result Value Ref Range   ISSUE DATE / TIME 270623762831    Blood Product Unit Number [id number]    Unit Type and Rh 5100    Blood Product Expiration Date 269485462703    ISSUE DATE / TIME 500938182993    Blood Product Unit Number [id number]    Unit Type and Rh 5100    Blood Product Expiration Date 751025852778    ISSUE DATE / TIME [id number]    Blood Product Unit Number [id number]    Unit Type and Rh 5100    Blood Product Expiration Date 326712458099    ISSUE DATE / TIME 833825053976    Blood Product Unit Number [id number]    Unit Type and Rh 5100    Blood Product Expiration Date 735329924268     Discharge Medications:    Allergies as of 11/10/2019   No Known Allergies     Medication List    TAKE these medications   HYDROcodone-acetaminophen 7.5-325 mg/15 ml solution Commonly known as: HYCET Take 10-15 mLs by mouth every 6 (six) hours as needed for up to 7 days for severe pain. Alternate OTC Motrin 600mg  and Tylenol 650mg  every 6 hours as needed for pain. May substitute Norco (Tylenol/narcotic) for plain Tylenol as needed.   HYDROcodone-acetaminophen 7.5-325 mg/15 ml solution Commonly known as: HYCET Take 10-15 mLs by mouth every 6 (six) hours as needed for up to 7 days for severe pain. Alternate OTC Motrin 600mg  and Tylenol 650mg  every 6 hours as needed for pain. May substitute Norco (Tylenol/narcotic) for plain Tylenol as needed.   LORazepam 1 MG tablet Commonly known as: ATIVAN Take 1 tablet by mouth at bedtime as needed.   losartan 50 MG tablet Commonly known as: COZAAR Take 50 mg by mouth daily. Taking 2 tablets daily for total of 100 mg.       Diagnostic Studies: No results found.  Disposition: Discharge disposition: 01-Home or Self Care       Discharge Instructions    Diet - low sodium heart healthy   Complete by: As directed    Discharge instructions   Complete by: As directed    Tonsillectomy Care After Refer to this sheet in the next few weeks. These instructions provide you with information on caring for yourself after your procedure. Your caregiver may also give you specific instructions. Your treatment has been planned according to current medical practices, but problems sometimes occur. Call your caregiver if you have any problems or questions after your procedure. HOME CARE INSTRUCTIONS  Obtain proper rest, keeping your head elevated at all times. You will feel worn out and tired for a while.  Drink plenty of fluids. This reduces pain and hastens the healing process.  Only take over-the-counter or prescription medicines for pain, discomfort, or fever as directed by your  caregiver. Do not take aspirin or nonsteroidal anti-inflammatory drugs. These medications increase the possibility of bleeding.  Sometimes the use of pain medication can cause constipation. If this happens, ask your caregiver about laxatives that you can take.  When eating, only eat a small portion of your  food and then take your prescribed pain medication. Eat the remainder of your food 45 minutes later. This will make swallowing less painful.  Soft and cold foods, such as gelatin, sherbet, ice cream, frozen ice pops, and cold drinks, are usually the easiest to eat. Several days after surgery, you will be able to eat more solid food.  Avoid mouth washes and gargles.  Avoid contact with people who have upper respiratory infections, such as colds and sore throats.  An ice pack applied to your neck may help with discomfort and keep swelling down.  SEEK MEDICAL CARE IF:  You have increasing pain that is not controlled with medications.  You have an oral temperature above 102 F (38.9 C).  You feel lightheaded or have a fainting spell.  You develop a rash.  SEEK IMMEDIATE MEDICAL CARE IF:  You have difficulty breathing.  You experience side effects or allergic reactions to medications.  You bleed bright red blood from your throat, or you vomit bright red blood.  MAKE SURE YOU: Understand these instructions.  Will watch your condition.  Will get help right away if you are not doing well or get worse.   Alternate ibuprofen and Tylenol every 6 hours as needed for pain management.  CALL [phone] for any questions or Emergency concerns.   Increase activity slowly   Complete by: As directed    No wound care   Complete by: As directed        Follow-up Information    Jerrell Belfast, MD In 3 weeks.   Specialty: Otolaryngology Contact information: [address] [place] [address] [phone]                Signed: Jerrell Belfast 11/10/2019, 10:38  AM

## 2019-11-10 NOTE — Progress Notes (Signed)
   ENT Progress Note: POD #1 s/p Procedure(s): Tonsillar Cauterization   Subjective: Minimal discomfort, no further bleeding  Objective: Vital signs in last 24 hours: Temp:  [97.7 F (36.5 C)-98.6 F (37 C)] 98.3 F (36.8 C) (09/29 0503) Pulse Rate:  [53-78] 78 (09/29 0503) Resp:  [9-19] 18 (09/29 0503) BP: (108-162)/(63-84) 108/63 (09/29 0503) SpO2:  [90 %-100 %] 97 % (09/29 0503) Weight change:  Last BM Date: 11/08/19  Intake/Output from previous day: 09/28 0701 - 09/29 0700 In: 1260 [P.O.:360; I.V.:250; IV Piggyback:500] Out: 1475 [Urine:1025; Blood:450] Intake/Output this shift: No intake/output data recorded.  Labs: Recent Labs    11/08/19 1200 11/08/19 1200 11/09/19 1219 11/09/19 1551  WBC 8.4  --   --  10.9*  HGB 14.9   < > 12.9* 11.1*  HCT 43.9   < > 38.0* 33.4*  PLT 182  --   --  170   < > = values in this interval not displayed.   Recent Labs    11/08/19 1200 11/09/19 1219  NA 139 136  K 4.1 4.2  CL 102 100  CO2 27  --   GLUCOSE 117* 209*  BUN 13 28*  CALCIUM 9.5  --     Studies/Results: No results found.   PHYSICAL EXAM: Tonsillar fossa intact, no clotting or bleeding, airway stable.   Assessment/Plan: Patient stable after cautery for acute post tonsillectomy hemorrhage.  Patient's postoperative hemoglobin & hematocrit 11/33.  No transfusion or blood products administered.  Patient stable, discharged home.    Jerrell Belfast 11/10/2019, 10:35 AM

## 2019-11-10 NOTE — Discharge Planning (Signed)
Mike Jenkins to be D/C'd  per MD order. Discussed with the patient and all questions fully answered.  VSS, Skin clean, dry and intact without evidence of skin break down, no evidence of skin tears noted.  IV catheter discontinued intact. Site without signs and symptoms of complications. Dressing and pressure applied.  An After Visit Summary was printed and given to the patient. Patient received prescription.  D/c education completed with patient/family including follow up instructions, medication list, d/c activities limitations if indicated, with other d/c instructions as indicated by MD - patient able to verbalize understanding, all questions fully answered.   Patient instructed to return to ED, call 911, or call MD for any changes in condition.   Patient to be escorted via [place], and D/C home via private auto.

## 2019-11-11 ENCOUNTER — Ambulatory Visit (HOSPITAL_BASED_OUTPATIENT_CLINIC_OR_DEPARTMENT_OTHER): Admission: RE | Admit: 2019-11-11 | Payer: 59 | Source: Home / Self Care | Admitting: Surgery

## 2019-11-11 HISTORY — DX: Localized enlarged lymph nodes: R59.0

## 2019-11-11 SURGERY — BIOPSY, LYMPH NODE, INGUINAL, OPEN
Anesthesia: General | Laterality: Right

## 2019-11-15 ENCOUNTER — Ambulatory Visit: Payer: 59 | Admitting: Hematology

## 2019-11-15 ENCOUNTER — Telehealth: Payer: Self-pay | Admitting: *Deleted

## 2019-11-15 NOTE — Telephone Encounter (Signed)
Mike Jenkins w/Dr. Burnard Bunting calling to f/u on "urgent" referral to [facility] New diagnosis: Lymphoma. Patient has not heard from our office re: new patient appointment yet. Patient is scheduled for lymph node biopsy 12/16/19.

## 2019-11-15 NOTE — Telephone Encounter (Signed)
Contacted patient at Dr. Grier Mitts request - schedule as New Patient on 10/6 at 12N if patient able to come then. Patient in agreement. Schedule message sent.  Advised patient to come 15-20 minutes early to appt for registration. Contacted Anderson Malta with Dr. Jacquiline Doe office to provide date/time of appt. She will fax most recent office notes/labs

## 2019-11-17 ENCOUNTER — Inpatient Hospital Stay: Payer: 59

## 2019-11-17 ENCOUNTER — Inpatient Hospital Stay: Payer: 59 | Attending: Hematology | Admitting: Hematology

## 2019-11-17 ENCOUNTER — Other Ambulatory Visit: Payer: Self-pay

## 2019-11-17 VITALS — BP 115/67 | HR 69 | Temp 97.4°F | Resp 18 | Ht 74.0 in | Wt 217.9 lb

## 2019-11-17 DIAGNOSIS — J45909 Unspecified asthma, uncomplicated: Secondary | ICD-10-CM | POA: Insufficient documentation

## 2019-11-17 DIAGNOSIS — Z85820 Personal history of malignant melanoma of skin: Secondary | ICD-10-CM | POA: Diagnosis not present

## 2019-11-17 DIAGNOSIS — C8298 Follicular lymphoma, unspecified, lymph nodes of multiple sites: Secondary | ICD-10-CM

## 2019-11-17 DIAGNOSIS — Z79899 Other long term (current) drug therapy: Secondary | ICD-10-CM | POA: Diagnosis not present

## 2019-11-17 DIAGNOSIS — I10 Essential (primary) hypertension: Secondary | ICD-10-CM | POA: Diagnosis not present

## 2019-11-17 DIAGNOSIS — N400 Enlarged prostate without lower urinary tract symptoms: Secondary | ICD-10-CM | POA: Diagnosis not present

## 2019-11-17 DIAGNOSIS — Z87891 Personal history of nicotine dependence: Secondary | ICD-10-CM | POA: Insufficient documentation

## 2019-11-17 LAB — CMP (CANCER CENTER ONLY)
ALT: 17 U/L (ref 0–44)
AST: 13 U/L — ABNORMAL LOW (ref 15–41)
Albumin: 4 g/dL (ref 3.5–5.0)
Alkaline Phosphatase: 54 U/L (ref 38–126)
Anion gap: 7 (ref 5–15)
BUN: 13 mg/dL (ref 8–23)
CO2: 29 mmol/L (ref 22–32)
Calcium: 9.7 mg/dL (ref 8.9–10.3)
Chloride: 106 mmol/L (ref 98–111)
Creatinine: 1.04 mg/dL (ref 0.61–1.24)
GFR, Estimated: >60 mL/min (ref >60)
Glucose, Bld: 105 mg/dL — ABNORMAL HIGH (ref 70–99)
Potassium: 3.9 mmol/L (ref 3.5–5.1)
Sodium: 142 mmol/L (ref 135–145)
Total Bilirubin: 0.5 mg/dL (ref 0.3–1.2)
Total Protein: 7.1 g/dL (ref 6.5–8.1)

## 2019-11-17 LAB — CBC WITH DIFFERENTIAL/PLATELET
Abs Immature Granulocytes: 0.06 10*3/uL (ref 0.00–0.07)
Basophils Absolute: 0 10*3/uL (ref 0.0–0.1)
Basophils Relative: 0 %
Eosinophils Absolute: 0 10*3/uL (ref 0.0–0.5)
Eosinophils Relative: 1 %
HCT: 31.5 % — ABNORMAL LOW (ref 39.0–52.0)
Hemoglobin: 10.7 g/dL — ABNORMAL LOW (ref 13.0–17.0)
Immature Granulocytes: 1 %
Lymphocytes Relative: 26 %
Lymphs Abs: 1.8 10*3/uL (ref 0.7–4.0)
MCH: 31.9 pg (ref 26.0–34.0)
MCHC: 34 g/dL (ref 30.0–36.0)
MCV: 94 fL (ref 80.0–100.0)
Monocytes Absolute: 0.4 10*3/uL (ref 0.1–1.0)
Monocytes Relative: 6 %
Neutro Abs: 4.5 10*3/uL (ref 1.7–7.7)
Neutrophils Relative %: 66 %
Platelets: 235 10*3/uL (ref 150–400)
RBC: 3.35 MIL/uL — ABNORMAL LOW (ref 4.22–5.81)
RDW: 13.2 % (ref 11.5–15.5)
WBC: 6.9 10*3/uL (ref 4.0–10.5)
nRBC: 0 % (ref 0.0–0.2)

## 2019-11-17 LAB — HIV ANTIBODY (ROUTINE TESTING W REFLEX): HIV Screen 4th Generation wRfx: NONREACTIVE

## 2019-11-17 LAB — HEPATITIS B CORE ANTIBODY, TOTAL: Hep B Core Total Ab: NONREACTIVE

## 2019-11-17 LAB — LACTATE DEHYDROGENASE: LDH: 115 U/L (ref 98–192)

## 2019-11-17 LAB — HEPATITIS C ANTIBODY: HCV Ab: NONREACTIVE

## 2019-11-17 LAB — HEPATITIS B SURFACE ANTIGEN: Hepatitis B Surface Ag: NONREACTIVE

## 2019-11-17 NOTE — Patient Instructions (Signed)
Thank you for choosing [facility] to provide your oncology and hematology care.   Should you have questions after your visit to the [facility] ([facility]), please contact this office at [phone] between 8:30 AM and 4:30 PM.  Voice mails left after 4:00 PM may not be returned until the following business day.  Calls received after 4:30 PM will be answered by an off-site Nurse Triage Line.    Prescription Refills:  Please have your pharmacy contact us directly for most prescription requests.  Contact the office directly for refills of narcotics (pain medications). Allow 48-72 hours for refills.  Appointments: Please contact the [facility] scheduling department [phone] for questions regarding [facility] appointment scheduling.  Contact the schedulers with any scheduling changes so that your appointment can be rescheduled in a timely manner.   Central Scheduling for [place] [phone] - Call to schedule procedures such as PET scans, CT scans, MRI, Ultrasound, etc.  To afford each patient quality time with our providers, please arrive 30 minutes before your scheduled appointment time.  If you arrive late for your appointment, you may be asked to reschedule.  We strive to give you quality time with our providers, and arriving late affects you and other patients whose appointments are after yours. If you are a no show for multiple scheduled visits, you may be dismissed from the clinic at the providers discretion.     Resources: [facility] Social Workers [phone] for additional information on assistance programs or assistance connecting with community support programs   [place] DSS  336-641-3447: Information regarding food stamps, Medicaid, and utility assistance [place] [employer] [place] 336-333-6589   [place] [employer]'s shared-ride transportation service for eligible riders who have a disability that prevents them from riding the fixed route bus.   Medicare  Rights Center 800-333-4114 Helps people with Medicare understand their rights and benefits, navigate the Medicare system, and secure the quality healthcare they deserve [employer] 800-227-2345 Assists patients locate various types of support and financial assistance Cancer Care: 1-800-813-HOPE (4673) Provides financial assistance, online support groups, medication/co-pay assistance.   Transportation Assistance for appointments at [facility]: Transportation Coordinator [phone]  Again, thank you for choosing [facility] for your care.       

## 2019-11-17 NOTE — Progress Notes (Signed)
HEMATOLOGY/ONCOLOGY CONSULTATION NOTE  Date of Service: 11/17/2019  Patient Care Team: Burnard Bunting, MD as PCP - General (Internal Medicine)  CHIEF COMPLAINTS/PURPOSE OF CONSULTATION:  Lymphoma  HISTORY OF PRESENTING ILLNESS:   Mike Jenkins is a wonderful 64 y.o. male who has been referred to [place] by Dr. Burnard Bunting for evaluation and management of lymphoma.. The pt reports that he is doing well overall.   The pt reports that he developed rt tonsillar swelling a few months ago which did not respond to antibiotics and he was sent to ENT. He had a CT neck which showed- CT Soft Tissue Neck ([id number]) completed on 10/06/2019 with results revealing "Asymmetric fullness of the right tonsil. Although no masslike enhancement or submucosal tumor, recommend continued workup/biopsy given there is also a right level 2 lymphadenopathy."   Patient noted a lump in the rt groin and had an MRI Abd/Pel ([id number]) ([id number]) completed on 09/22/2019 with results revealing "1. A right inguinal lymph node is pathologically enlarged with short axis diameter of 1.8 cm and diffuse enhancement. Possibilities may include reactive adenopathy or malignant adenopathy, for example in the setting of lymphoma. Tenderness associated with such lymph nodes is often considered in it indicator of a benign reactive etiology although this is not absolute, and if the lymph node fails to resolve spontaneously, biopsy may be warranted. 2. Chronic stranding in the central upper abdominal mesentery with scattered small mesenteric lymph nodes. This is unchanged from 2015 and has a pattern favoring chronic sclerosing mesenteritis although a similar pattern can occasionally be encountered in the setting of lymphoma. 3. Small right adrenal adenoma. 4. Bosniak category IIF cystic lesion of the left kidney lower pole. This complex cyst has some internal septations and is probably benign but warrants surveillance. 5. Benign  prostatic hypertrophy. 6. Suspected small gallstones. 7.  Aortic Atherosclerosis."  Of note prior to the patient's visit today, pt has had Right Tonsillectomy Surgical Pathology Report [id number]) completed on 11/04/2019 with results revealing "Non-Hodgkin B-cell lymphoma- likely folliculat lymphoma."    Most recent lab results (11/09/2019) of CBC is as follows: all values are WNL except for WBC at 10.9K, RBC at 3.60, Hgb at 11.1, HCT at 33.4.  On review of systems, pt reports no fevers/chi//snight sweat or significant weight loss .  MEDICAL HISTORY:  Past Medical History:  Diagnosis Date  . Asthma   . Colon polyps    hyperplastic  . Diverticulosis   . Heart murmur    since he was a baby-echo done preop hip surg 2003-  . Hypertension   . Lymphadenopathy, inguinal    right  . Melanoma ([place])     SURGICAL HISTORY: Past Surgical History:  Procedure Laterality Date  . COLONOSCOPY    . I & D EXTREMITY  12/26/2011   Procedure: IRRIGATION AND DEBRIDEMENT EXTREMITY;  Surgeon: Tennis Must, MD;  Location: [facility];  Service: Orthopedics;  Laterality: Left;  . KNEE ARTHROSCOPY Bilateral 1995, 1998   right and left  . MASS EXCISION  12/26/2011   Procedure: EXCISION MASS;  Surgeon: Tennis Must, MD;  Location: [facility];  Service: Orthopedics;  Laterality: Left;  LEFT SMALL EXCISION MASS & DEBRIDEMENT DIP JOINT  . MELANOMA EXCISION Left 2013  . POLYPECTOMY    . tonsillar cauterization  11/09/2019   Tonsillar Cauterization (N/A Throat)  . TONSILLECTOMY Right 11/04/2019   Procedure: TONSILLECTOMY;  Surgeon: Jerrell Belfast, MD;  Location: [facility];  Service: ENT;  Laterality: Right;  . TONSILLECTOMY N/A 11/09/2019   Procedure: Tonsillar Cauterization;  Surgeon: Jerrell Belfast, MD;  Location: [facility];  Service: ENT;  Laterality: N/A;  . TOTAL HIP ARTHROPLASTY Left 2003    SOCIAL HISTORY: Social History   Socioeconomic History  .  Marital status: Married    Spouse name: Not on file  . Number of children: 3  . Years of education: Not on file  . Highest education level: Not on file  Occupational History  . Occupation: Information systems manager: [employer]  Tobacco Use  . Smoking status: Former Research scientist (life sciences)  . Smokeless tobacco: Never Used  Vaping Use  . Vaping Use: Never used  Substance and Sexual Activity  . Alcohol use: Yes    Comment: 2-3 drinks per week  . Drug use: No  . Sexual activity: Not on file  Other Topics Concern  . Not on file  Social History Narrative  . Not on file   Social Determinants of Health   Financial Resource Strain:   . Difficulty of Paying Living Expenses: Not on file  Food Insecurity:   . Worried About Charity fundraiser in the Last Year: Not on file  . Ran Out of Food in the Last Year: Not on file  Transportation Needs:   . Lack of Transportation (Medical): Not on file  . Lack of Transportation (Non-Medical): Not on file  Physical Activity:   . Days of Exercise per Week: Not on file  . Minutes of Exercise per Session: Not on file  Stress:   . Feeling of Stress : Not on file  Social Connections:   . Frequency of Communication with Friends and Family: Not on file  . Frequency of Social Gatherings with Friends and Family: Not on file  . Attends Religious Services: Not on file  . Active Member of Clubs or Organizations: Not on file  . Attends Archivist Meetings: Not on file  . Marital Status: Not on file  Intimate Partner Violence:   . Fear of Current or Ex-Partner: Not on file  . Emotionally Abused: Not on file  . Physically Abused: Not on file  . Sexually Abused: Not on file    FAMILY HISTORY: Family History  Problem Relation Age of Onset  . Colon polyps Father   . Colon cancer Neg Hx   . Esophageal cancer Neg Hx   . Rectal cancer Neg Hx   . Stomach cancer Neg Hx     ALLERGIES:  has No Known Allergies.  MEDICATIONS:  Current Outpatient  Medications  Medication Sig Dispense Refill  . LORazepam (ATIVAN) 1 MG tablet Take 1 tablet by mouth at bedtime as needed.    Marland Kitchen losartan (COZAAR) 50 MG tablet Take 50 mg by mouth daily. Taking 2 tablets daily for total of 100 mg.     No current facility-administered medications for this visit.    REVIEW OF SYSTEMS:    10 Point review of Systems was done is negative except as noted above.  PHYSICAL EXAMINATION: ECOG PERFORMANCE STATUS: 1 - Symptomatic but completely ambulatory  . Vitals:   11/17/19 1228  BP: 115/67  Pulse: 69  Resp: 18  Temp: (!) 97.4 F (36.3 C)  SpO2: 100%   Filed Weights   11/17/19 1228  Weight: 217 lb 14.4 oz (98.8 kg)   .Body mass index is 27.98 kg/m.  NAD GENERAL:alert, in no acute distress and comfortable SKIN: no acute rashes, no significant lesions EYES:  conjunctiva are pink and non-injected, sclera anicteric OROPHARYNX: MMM, no exudates, no oropharyngeal erythema or ulceration NECK: supple, no JVD LYMPH:  no palpable lymphadenopathy in the cervical, axillary or inguinal regions LUNGS: clear to auscultation b/l with normal respiratory effort HEART: regular rate & rhythm ABDOMEN:  normoactive bowel sounds , non tender, not distended. Extremity: no pedal edema PSYCH: alert & oriented x 3 with fluent speech NEURO: no focal motor/sensory deficits  LABORATORY DATA:  I have reviewed the data as listed  . CBC Latest Ref Rng & Units 11/09/2019 11/09/2019 11/08/2019  WBC 4.0 - 10.5 K/uL 10.9(H) - 8.4  Hemoglobin 13.0 - 17.0 g/dL 11.1(L) 12.9(L) 14.9  Hematocrit 39 - 52 % 33.4(L) 38.0(L) 43.9  Platelets 150 - 400 K/uL 170 - 182    . CMP Latest Ref Rng & Units 11/09/2019 11/08/2019 12/20/2013  Glucose 70 - 99 mg/dL 209(H) 117(H) 113(H)  BUN 8 - 23 mg/dL 28(H) 13 17  Creatinine 0.61 - 1.24 mg/dL 1.20 1.12 0.93  Sodium 135 - 145 mmol/L 136 139 140  Potassium 3.5 - 5.1 mmol/L 4.2 4.1 4.2  Chloride 98 - 111 mmol/L 100 102 100  CO2 22 - 32 mmol/L -  27 22  Calcium 8.9 - 10.3 mg/dL - 9.5 9.9  Total Protein 6.5 - 8.1 g/dL - 6.8 7.3  Total Bilirubin 0.3 - 1.2 mg/dL - 0.9 0.7  Alkaline Phos 38 - 126 U/L - 51 50  AST 15 - 41 U/L - 31 18  ALT 0 - 44 U/L - 28 25   11/04/2019 Right Tonsillectomy Surgical Pathology Report [id number]):  Surgical Pathology Report Clinical History: None provided FINAL MICROSCOPIC DIAGNOSIS: A. TONSIL, RIGHT, TONSILLECTOMY: -Non-Hodgkin B-cell lymphoma -See comment COMMENT: The sections show effacement of the tonsillar tissue by a dense lymphoid proliferation characterized by numerous ill-defined atypical lymphoid follicles displaying attenuated or absent mantle zones, lack of polarity and a homogenous composition of lymphocytes. The follicles appear to coalesce in many areas forming a diffuse pattern of involvement. In both the follicular and diffuse areas, a similar composition of lymphocytes is generally seen characterized by predominance of small round to angulated lymphocytes with high nuclear cytoplasmic ratio, dense chromatin and small to inconspicuous nucleoli admixed with larger centrocytes and centroblastic lymphoid cells to a lesser extent. The number of the latter varies but appears to average slightly less than 15 cells/HPF. Scattered residual squamous epithelial clusters are seen. A portion of the tonsil appears relatively uninvolved. Flow cytometric analysis was performed [facility] [phone]) but was essentially non-contributory. A battery of immunohistochemical stains was performed with appropriate controls and show that there is a significant B-cell component in the follicular and diffuse components as seen with B-cell markers CD20 and CD79a associated with CD10, BCL6 and BCL2 expression. This is admixed with relative abundance of T cells as seen with CD3, CD5 and CD43. There is no apparent co-expression of CD5 in B-cell areas and no significant cyclin D1 positivity is identified. CD21 highlights  dendritic networks in previously described follicles. CD138 highlights a relatively minor plasma cell component which shows polyclonal staining pattern for kappa and lambda light chains. The overall features are atypical and most consistent with involvement by non-Hodgkin B-cell lymphoma, follicular type with follicular and diffuse pattern. While grading of this process is somewhat challenging and the appearance is borderline, a low-grade process is slightly favored at this time (grade 2/3).   RADIOGRAPHIC STUDIES: I have personally reviewed the radiological images as listed and agreed with the findings in  the report. No results found.   CT neck 8/26: IMPRESSION: Asymmetric fullness of the right tonsil. Although no masslike enhancement or submucosal tumor, recommend continued workup/biopsy given there is also a right level 2 lymphadenopathy.   Electronically Signed   By: Monte Fantasia M.D.   On: 10/07/2019 07:28  MRI bad/pelvis IMPRESSION: 1. A right inguinal lymph node is pathologically enlarged with short axis diameter of 1.8 cm and diffuse enhancement. Possibilities may include reactive adenopathy or malignant adenopathy, for example in the setting of lymphoma. Tenderness associated with such lymph nodes is often considered in it indicator of a benign reactive etiology although this is not absolute, and if the lymph node fails to resolve spontaneously, biopsy may be warranted. 2. Chronic stranding in the central upper abdominal mesentery with scattered small mesenteric lymph nodes. This is unchanged from 2015 and has a pattern favoring chronic sclerosing mesenteritis although a similar pattern can occasionally be encountered in the setting of lymphoma. 3. Small right adrenal adenoma. 4. Bosniak category IIF cystic lesion of the left kidney lower pole. This complex cyst has some internal septations and is probably benign but warrants surveillance. Follow up renal protocol  MRI is recommended in 6 months time. This recommendation follows ACR consensus guidelines: Management of the Incidental Renal Mass on CT: A White Paper of the ACR Incidental Findings Committee. J Am Coll Radiol [id number]. 5. Benign prostatic hypertrophy. 6. Suspected small gallstones. 7.  Aortic Atherosclerosis (ICD10-I70.0).   Electronically Signed   By: Van Clines M.D.   On: 09/22/2019 17:02  ASSESSMENT & PLAN:   64 yo with   1) Newly diagnosed at least stage III Follicular likely low grade but grading difficult to determine from tonsillar sample 2) Inguiinal LNadenopathy- likely related to lymphoma  PLAN: -I discussed the diagnosis, prognosis, workup/evaluation and likely treatment consideration and prognosis of likely low grade follicular lymphoma -PET/CT for initial evaluation -CT bone marrow aspiration/Bx to continue initial staging of FL -labs today includin ghep b profile   FOLLOW QZ:ESPQ today PET/CT in 1 week CT bone marrow biopsy in 1 week RTC with Dr Irene Limbo in 2 wee  . Orders Placed This Encounter  Procedures  . NM PET Image Initial (PI) Skull Base To Thigh    Standing Status:   Future    Standing Expiration Date:   11/16/2020    Order Specific Question:   If indicated for the ordered procedure, I authorize the administration of a radiopharmaceutical per Radiology protocol    Answer:   Yes    Order Specific Question:   Preferred imaging location?    Answer:   [facility]  . CT BONE MARROW BIOPSY & ASPIRATION    Standing Status:   Future    Standing Expiration Date:   11/16/2020    Order Specific Question:   Reason for Exam (SYMPTOM  OR DIAGNOSIS REQUIRED)    Answer:   unilateral bone marrow aspiration and biopsy for initial staging of newly diagnosed follicular lymphoma    Order Specific Question:   Preferred location?    Answer:   [facility]  . CT Biopsy    Standing Status:   Future    Standing Expiration Date:   11/16/2020     Order Specific Question:   Lab orders requested (DO NOT place separate lab orders, these will be automatically ordered during procedure specimen collection):    Answer:   Surgical Pathology    Order Specific Question:   Reason for Exam (SYMPTOM  OR DIAGNOSIS REQUIRED)    Answer:   unilateral bone marrow aspiration and biopsy for initial staging of newly diagnosed follicular lymphoma    Comments:   flow cytometry, molecular studies as needed    Order Specific Question:   Preferred location?    Answer:   [facility]  . CBC with Differential/Platelet    Standing Status:   Future    Number of Occurrences:   1    Standing Expiration Date:   11/16/2020  . CMP ([place] only)    Standing Status:   Future    Number of Occurrences:   1    Standing Expiration Date:   11/16/2020  . Lactate dehydrogenase    Standing Status:   Future    Number of Occurrences:   1    Standing Expiration Date:   11/16/2020  . Hepatitis C antibody    Standing Status:   Future    Number of Occurrences:   1    Standing Expiration Date:   11/16/2020  . Hepatitis B core antibody, total    Standing Status:   Future    Number of Occurrences:   1    Standing Expiration Date:   11/16/2020  . Hepatitis B surface antigen    Standing Status:   Future    Number of Occurrences:   1    Standing Expiration Date:   11/16/2020  . HIV Antibody (routine testing w rflx)    Standing Status:   Future    Number of Occurrences:   1    Standing Expiration Date:   11/16/2020    All of the patients questions were answered with apparent satisfaction. The patient knows to call the clinic with any problems, questions or concerns.  I spent 45 mins counseling the patient face to face. The total time spent in the appointment was 44 and more than 50% was on counseling and direct patient cares.    Sullivan Lone MD [place] AAHIVMS [facility] Hematology/Oncology Physician [facility]  (Office):       [phone] (Work cell):   [phone] (Fax):           [phone]  11/17/2019 9:16 AM  I, Yevette Edwards, am acting as a scribe for Dr. Sullivan Lone.   .I have reviewed the above documentation for accuracy and completeness, and I agree with the above.Brunetta Genera MD

## 2019-11-23 ENCOUNTER — Encounter (HOSPITAL_COMMUNITY): Payer: Self-pay | Admitting: Otolaryngology

## 2019-11-23 ENCOUNTER — Encounter: Payer: Self-pay | Admitting: Hematology

## 2019-11-25 ENCOUNTER — Other Ambulatory Visit: Payer: Self-pay | Admitting: Radiology

## 2019-11-25 ENCOUNTER — Other Ambulatory Visit: Payer: Self-pay | Admitting: Student

## 2019-11-25 LAB — SURGICAL PATHOLOGY

## 2019-11-26 ENCOUNTER — Encounter (HOSPITAL_COMMUNITY): Payer: Self-pay

## 2019-11-26 ENCOUNTER — Ambulatory Visit (HOSPITAL_COMMUNITY)
Admission: RE | Admit: 2019-11-26 | Discharge: 2019-11-26 | Disposition: A | Discharge status: Home / Self Care | Payer: 59 | Source: Ambulatory Visit | Attending: Hematology | Admitting: Hematology

## 2019-11-26 ENCOUNTER — Other Ambulatory Visit: Payer: Self-pay

## 2019-11-26 ENCOUNTER — Telehealth: Payer: Self-pay | Admitting: Medical Oncology

## 2019-11-26 DIAGNOSIS — Z96642 Presence of left artificial hip joint: Secondary | ICD-10-CM | POA: Diagnosis not present

## 2019-11-26 DIAGNOSIS — I10 Essential (primary) hypertension: Secondary | ICD-10-CM | POA: Insufficient documentation

## 2019-11-26 DIAGNOSIS — Z79899 Other long term (current) drug therapy: Secondary | ICD-10-CM | POA: Insufficient documentation

## 2019-11-26 DIAGNOSIS — D649 Anemia, unspecified: Secondary | ICD-10-CM | POA: Diagnosis not present

## 2019-11-26 DIAGNOSIS — R011 Cardiac murmur, unspecified: Secondary | ICD-10-CM | POA: Insufficient documentation

## 2019-11-26 DIAGNOSIS — C8298 Follicular lymphoma, unspecified, lymph nodes of multiple sites: Secondary | ICD-10-CM

## 2019-11-26 DIAGNOSIS — K635 Polyp of colon: Secondary | ICD-10-CM | POA: Diagnosis not present

## 2019-11-26 DIAGNOSIS — J45909 Unspecified asthma, uncomplicated: Secondary | ICD-10-CM | POA: Insufficient documentation

## 2019-11-26 DIAGNOSIS — Z87891 Personal history of nicotine dependence: Secondary | ICD-10-CM | POA: Insufficient documentation

## 2019-11-26 DIAGNOSIS — C8210 Follicular lymphoma grade II, unspecified site: Secondary | ICD-10-CM | POA: Diagnosis present

## 2019-11-26 LAB — CBC WITH DIFFERENTIAL/PLATELET
Abs Immature Granulocytes: 0.04 10*3/uL (ref 0.00–0.07)
Basophils Absolute: 0 10*3/uL (ref 0.0–0.1)
Basophils Relative: 0 %
Eosinophils Absolute: 0.1 10*3/uL (ref 0.0–0.5)
Eosinophils Relative: 2 %
HCT: 36.2 % — ABNORMAL LOW (ref 39.0–52.0)
Hemoglobin: 12.1 g/dL — ABNORMAL LOW (ref 13.0–17.0)
Immature Granulocytes: 1 %
Lymphocytes Relative: 31 %
Lymphs Abs: 1.8 10*3/uL (ref 0.7–4.0)
MCH: 32.4 pg (ref 26.0–34.0)
MCHC: 33.4 g/dL (ref 30.0–36.0)
MCV: 97.1 fL (ref 80.0–100.0)
Monocytes Absolute: 0.5 10*3/uL (ref 0.1–1.0)
Monocytes Relative: 8 %
Neutro Abs: 3.5 10*3/uL (ref 1.7–7.7)
Neutrophils Relative %: 58 %
Platelets: 209 10*3/uL (ref 150–400)
RBC: 3.73 MIL/uL — ABNORMAL LOW (ref 4.22–5.81)
RDW: 13.9 % (ref 11.5–15.5)
WBC: 6 10*3/uL (ref 4.0–10.5)
nRBC: 0 % (ref 0.0–0.2)

## 2019-11-26 MED ORDER — NALOXONE HCL 0.4 MG/ML IJ SOLN
INTRAMUSCULAR | Status: AC
  Filled 2019-11-26: qty 1

## 2019-11-26 MED ORDER — LIDOCAINE HCL (PF) 1 % IJ SOLN
INTRAMUSCULAR | Status: AC | PRN
  Administered 2019-11-26: 10 mL via INTRADERMAL

## 2019-11-26 MED ORDER — FLUMAZENIL 0.5 MG/5ML IV SOLN
INTRAVENOUS | Status: AC
  Filled 2019-11-26: qty 5

## 2019-11-26 MED ORDER — MIDAZOLAM HCL 2 MG/2ML IJ SOLN
INTRAMUSCULAR | Status: AC | PRN
  Administered 2019-11-26: 2 mg via INTRAVENOUS
  Administered 2019-11-26 (×2): 1 mg via INTRAVENOUS

## 2019-11-26 MED ORDER — FENTANYL CITRATE (PF) 100 MCG/2ML IJ SOLN
INTRAMUSCULAR | Status: AC | PRN
  Administered 2019-11-26 (×2): 50 ug via INTRAVENOUS

## 2019-11-26 MED ORDER — MIDAZOLAM HCL 2 MG/2ML IJ SOLN
INTRAMUSCULAR | Status: AC
  Filled 2019-11-26: qty 4

## 2019-11-26 MED ORDER — FENTANYL CITRATE (PF) 100 MCG/2ML IJ SOLN
INTRAMUSCULAR | Status: AC
  Filled 2019-11-26: qty 2

## 2019-11-26 MED ORDER — SODIUM CHLORIDE 0.9 % IV SOLN: INTRAVENOUS | Status: DC

## 2019-11-26 NOTE — Procedures (Addendum)
Interventional Radiology Procedure Note  Procedure: CT guided bone marrow aspiration and biopsy  Complications: None  EBL: < 10 mL  Findings: Aspirate and core biopsy performed of bone marrow in right iliac bone.  Plan: Bedrest supine x 1 hrs  Mike Jenkins, M.D Pager:  [phone]   

## 2019-11-26 NOTE — Discharge Instructions (Addendum)
Please call Interventional Radiology clinic [phone] with any questions or concerns.  You may remove your dressing and shower tomorrow.   Bone Marrow Aspiration and Bone Marrow Biopsy, Adult, Care After This sheet gives you information about how to care for yourself after your procedure. Your health care provider may also give you more specific instructions. If you have problems or questions, contact your health care provider. What can I expect after the procedure? After the procedure, it is common to have:  Mild pain and tenderness.  Swelling.  Bruising. Follow these instructions at home: Puncture site care   Follow instructions from your health care provider about how to take care of the puncture site. Make sure you: ? Wash your hands with soap and water before and after you change your bandage (dressing). If soap and water are not available, use hand sanitizer. ? Change your dressing as told by your health care provider.  Check your puncture site every day for signs of infection. Check for: ? More redness, swelling, or pain. ? Fluid or blood. ? Warmth. ? Pus or a bad smell. Activity  Return to your normal activities as told by your health care provider. Ask your health care provider what activities are safe for you.  Do not lift anything that is heavier than 10 lb (4.5 kg), or the limit that you are told, until your health care provider says that it is safe.  Do not drive for 24 hours if you were given a sedative during your procedure. General instructions   Take over-the-counter and prescription medicines only as told by your health care provider.  Do not take baths, swim, or use a hot tub until your health care provider approves. Ask your health care provider if you may take showers. You may only be allowed to take sponge baths.  If directed, put ice on the affected area. To do this: ? Put ice in a plastic bag. ? Place a towel between your skin and the  bag. ? Leave the ice on for 20 minutes, 2-3 times a day.  Keep all follow-up visits as told by your health care provider. This is important. Contact a health care provider if:  Your pain is not controlled with medicine.  You have a fever.  You have more redness, swelling, or pain around the puncture site.  You have fluid or blood coming from the puncture site.  Your puncture site feels warm to the touch.  You have pus or a bad smell coming from the puncture site. Summary  After the procedure, it is common to have mild pain, tenderness, swelling, and bruising.  Follow instructions from your health care provider about how to take care of the puncture site and what activities are safe for you.  Take over-the-counter and prescription medicines only as told by your health care provider.  Contact a health care provider if you have any signs of infection, such as fluid or blood coming from the puncture site. This information is not intended to replace advice given to you by your health care provider. Make sure you discuss any questions you have with your health care provider. Document Revised: 06/16/2018 Document Reviewed: 06/16/2018 Elsevier Patient Education  2020 Elsevier Inc.   Moderate Conscious Sedation, Adult, Care After These instructions provide you with information about caring for yourself after your procedure. Your health care provider may also give you more specific instructions. Your treatment has been planned according to current medical practices, but problems sometimes occur. Call   your health care provider if you have any problems or questions after your procedure. What can I expect after the procedure? After your procedure, it is common:  To feel sleepy for several hours.  To feel clumsy and have poor balance for several hours.  To have poor judgment for several hours.  To vomit if you eat too soon. Follow these instructions at home: For at least 24 hours after  the procedure:   Do not: ? Participate in activities where you could fall or become injured. ? Drive. ? Use heavy machinery. ? Drink alcohol. ? Take sleeping pills or medicines that cause drowsiness. ? Make important decisions or sign legal documents. ? Take care of children on your own.  Rest. Eating and drinking  Follow the diet recommended by your health care provider.  If you vomit: ? Drink water, juice, or soup when you can drink without vomiting. ? Make sure you have little or no nausea before eating solid foods. General instructions  Have a responsible adult stay with you until you are awake and alert.  Take over-the-counter and prescription medicines only as told by your health care provider.  If you smoke, do not smoke without supervision.  Keep all follow-up visits as told by your health care provider. This is important. Contact a health care provider if:  You keep feeling nauseous or you keep vomiting.  You feel light-headed.  You develop a rash.  You have a fever. Get help right away if:  You have trouble breathing. This information is not intended to replace advice given to you by your health care provider. Make sure you discuss any questions you have with your health care provider. Document Revised: 01/10/2017 Document Reviewed: 05/20/2015 Elsevier Patient Education  2020 Elsevier Inc.  

## 2019-11-26 NOTE — Consult Note (Signed)
Chief Complaint: Patient was seen in consultation today for CT guided bone marrow biopsy  Referring Physician(s): Brunetta Genera  Supervising Physician: Aletta Edouard  Patient Status: [facility] - Out-pt  History of Present Illness: Mike Jenkins is a 64 y.o. male with past medical history of asthma, colon polyps, diverticulosis, hypertension, melanoma and newly diagnosed follicular lymphoma.  He presents today for CT-guided bone marrow biopsy for staging purposes.  Past Medical History:  Diagnosis Date  . Asthma   . Colon polyps    hyperplastic  . Diverticulosis   . Heart murmur    since he was a baby-echo done preop hip surg 2003-  . Hypertension   . Lymphadenopathy, inguinal    right  . Melanoma [facility])     Past Surgical History:  Procedure Laterality Date  . COLONOSCOPY    . I & D EXTREMITY  12/26/2011   Procedure: IRRIGATION AND DEBRIDEMENT EXTREMITY;  Surgeon: Tennis Must, MD;  Location: [place];  Service: Orthopedics;  Laterality: Left;  . KNEE ARTHROSCOPY Bilateral 1995, 1998   right and left  . MASS EXCISION  12/26/2011   Procedure: EXCISION MASS;  Surgeon: Tennis Must, MD;  Location: [place];  Service: Orthopedics;  Laterality: Left;  LEFT SMALL EXCISION MASS & DEBRIDEMENT DIP JOINT  . MELANOMA EXCISION Left 2013  . POLYPECTOMY    . tonsillar cauterization  11/09/2019   Tonsillar Cauterization (N/A Throat)  . TONSILLECTOMY Right 11/04/2019   Procedure: TONSILLECTOMY;  Surgeon: Jerrell Belfast, MD;  Location: [place];  Service: ENT;  Laterality: Right;  . TONSILLECTOMY N/A 11/09/2019   Procedure: Tonsillar Cauterization;  Surgeon: Jerrell Belfast, MD;  Location: [facility] OR;  Service: ENT;  Laterality: N/A;  . TOTAL HIP ARTHROPLASTY Left 2003    Allergies: Patient has no known allergies.  Medications: Prior to Admission medications   Medication Sig Start Date End Date Taking? Authorizing Provider   LORazepam (ATIVAN) 1 MG tablet Take 1 tablet by mouth at bedtime as needed. 09/11/18  Yes [provider]  losartan (COZAAR) 50 MG tablet Take 50 mg by mouth daily. Taking 2 tablets daily for total of 100 mg.   Yes [provider]     Family History  Problem Relation Age of Onset  . Colon polyps Father   . Colon cancer Neg Hx   . Esophageal cancer Neg Hx   . Rectal cancer Neg Hx   . Stomach cancer Neg Hx     Social History   Socioeconomic History  . Marital status: Married    Spouse name: Not on file  . Number of children: 3  . Years of education: Not on file  . Highest education level: Not on file  Occupational History  . Occupation: Information systems manager: [employer]  Tobacco Use  . Smoking status: Former Research scientist (life sciences)  . Smokeless tobacco: Never Used  Vaping Use  . Vaping Use: Never used  Substance and Sexual Activity  . Alcohol use: Yes    Comment: 2-3 drinks per week  . Drug use: No  . Sexual activity: Not on file  Other Topics Concern  . Not on file  Social History Narrative  . Not on file   Social Determinants of Health   Financial Resource Strain:   . Difficulty of Paying Living Expenses: Not on file  Food Insecurity:   . Worried About Charity fundraiser in the Last Year: Not on file  .  Ran Out of Food in the Last Year: Not on file  Transportation Needs:   . Lack of Transportation (Medical): Not on file  . Lack of Transportation (Non-Medical): Not on file  Physical Activity:   . Days of Exercise per Week: Not on file  . Minutes of Exercise per Session: Not on file  Stress:   . Feeling of Stress : Not on file  Social Connections:   . Frequency of Communication with Friends and Family: Not on file  . Frequency of Social Gatherings with Friends and Family: Not on file  . Attends Religious Services: Not on file  . Active Member of Clubs or Organizations: Not on file  . Attends Archivist Meetings: Not on file  . Marital  Status: Not on file     Review of Systems currently denies fever, headache, chest pain, dyspnea, cough, abdominal/back pain, nausea, vomiting or bleeding  Vital Signs: BP (!) 143/72 (BP Location: Right Arm)   Pulse 74   Temp 99.2 F (37.3 C) (Oral)   Resp 18   SpO2 100%   Physical Exam awake, alert.  Chest clear to auscultation bilaterally.  Heart with regular rate and rhythm, positive murmur.  Abdomen soft, positive bowel sounds, nontender.  No lower extremity edema.  Imaging: No results found.  Labs:  CBC: Recent Labs    11/08/19 1200 11/08/19 1200 11/09/19 1219 11/09/19 1551 11/17/19 1351 11/26/19 0750  WBC 8.4  --   --  10.9* 6.9 6.0  HGB 14.9   < > 12.9* 11.1* 10.7* 12.1*  HCT 43.9   < > 38.0* 33.4* 31.5* 36.2*  PLT 182  --   --  170 235 209   < > = values in this interval not displayed.    COAGS: No results for input(s): INR, APTT in the last 8760 hours.  BMP: Recent Labs    11/08/19 1200 11/09/19 1219 11/17/19 1351  NA 139 136 142  K 4.1 4.2 3.9  CL 102 100 106  CO2 27  --  29  GLUCOSE 117* 209* 105*  BUN 13 28* 13  CALCIUM 9.5  --  9.7  CREATININE 1.12 1.20 1.04  GFRNONAA >60  --  >60  GFRAA >60  --   --     LIVER FUNCTION TESTS: Recent Labs    11/08/19 1200 11/17/19 1351  BILITOT 0.9 0.5  AST 31 13*  ALT 28 17  ALKPHOS 51 54  PROT 6.8 7.1  ALBUMIN 3.9 4.0    TUMOR MARKERS: No results for input(s): AFPTM, CEA, CA199, CHROMGRNA in the last 8760 hours.  Assessment and Plan: 64 y.o. male with past medical history of asthma, colon polyps, diverticulosis, hypertension, melanoma and newly diagnosed follicular lymphoma.  He presents today for CT-guided bone marrow biopsy for staging purposes.Risks and benefits of procedure was discussed with the patient  including, but not limited to bleeding, infection, damage to adjacent structures or low yield requiring additional tests.  All of the questions were answered and there is agreement to  proceed.  Consent signed and in chart.     Thank you for this interesting consult.  I greatly enjoyed meeting Mike Jenkins and look forward to participating in their care.  A copy of this report was sent to the requesting provider on this date.  Electronically Signed: D. Rowe Robert, PA-C 11/26/2019, 9:03 AM   I spent a total of 20 minutes    in face to face in clinical consultation, greater  than 50% of which was counseling/coordinating care for CT-guided bone marrow biopsy

## 2019-11-26 NOTE — Telephone Encounter (Signed)
I told wife the hold up with insurance auth for PET is an answer about if pt is going to receive radiation. XRT is pending , needs PET and CT results

## 2019-11-29 ENCOUNTER — Telehealth: Payer: Self-pay | Admitting: *Deleted

## 2019-11-29 ENCOUNTER — Ambulatory Visit (HOSPITAL_COMMUNITY): Payer: 59

## 2019-11-29 NOTE — Telephone Encounter (Signed)
[name] is pending for PET scan - not yet scheduled. Per Dr. Irene Limbo - Patient appt with Dr. Irene Limbo on 10/20 to be postponed until PET is completed. Patient informed and is in agreement. Will contact office once PET is scheduled.

## 2019-11-30 LAB — SURGICAL PATHOLOGY

## 2019-12-01 ENCOUNTER — Ambulatory Visit (HOSPITAL_COMMUNITY): Admission: RE | Admit: 2019-12-01 | Payer: 59 | Source: Ambulatory Visit

## 2019-12-01 ENCOUNTER — Inpatient Hospital Stay: Payer: 59 | Admitting: Hematology

## 2019-12-01 LAB — SURGICAL PATHOLOGY

## 2019-12-03 ENCOUNTER — Ambulatory Visit (HOSPITAL_COMMUNITY): Payer: 59

## 2019-12-06 ENCOUNTER — Telehealth: Payer: Self-pay | Admitting: *Deleted

## 2019-12-06 NOTE — Telephone Encounter (Signed)
Received call from patient, requesting to speak with Carlyon Prows, RN or Dr. Irene Limbo. Advised that neither were in the office today but that Sandi, RN would be back in the office tomorrow. Asked pt if this RN could help him in any way. He states he has several questions to ask and as Sandi, RN knows him, he will call back tomorrow.  He did appreciate the call back.  Message sent to Inocencio Homes, RN

## 2019-12-07 ENCOUNTER — Telehealth: Payer: Self-pay | Admitting: *Deleted

## 2019-12-07 NOTE — Telephone Encounter (Signed)
Contacted patient in response to Mychart messages and phone call today regarding cancelling PET and request to cancel appointment with Dr. Irene Limbo. He researched clinical trials at [facility] and was offered an opportunity for evaluation to participate in a study there.  He has an appointment with them on Thursday 10/27 to discuss. He will contact office following his appointment at [facility].

## 2019-12-09 ENCOUNTER — Inpatient Hospital Stay: Payer: 59 | Admitting: Hematology

## 2019-12-10 ENCOUNTER — Telehealth: Payer: Self-pay | Admitting: Hematology

## 2019-12-10 NOTE — Telephone Encounter (Signed)
Released last ov note to [facility] to [phone]    Release:  [id number]

## 2019-12-13 ENCOUNTER — Other Ambulatory Visit (HOSPITAL_COMMUNITY): Payer: 59

## 2019-12-16 ENCOUNTER — Encounter (HOSPITAL_BASED_OUTPATIENT_CLINIC_OR_DEPARTMENT_OTHER): Admission: RE | Payer: Self-pay | Source: Home / Self Care

## 2019-12-16 ENCOUNTER — Ambulatory Visit (HOSPITAL_BASED_OUTPATIENT_CLINIC_OR_DEPARTMENT_OTHER): Admission: RE | Admit: 2019-12-16 | Payer: 59 | Source: Home / Self Care | Admitting: Surgery

## 2019-12-16 SURGERY — BIOPSY, LYMPH NODE, INGUINAL, OPEN
Anesthesia: General | Laterality: Right

## 2020-03-09 ENCOUNTER — Other Ambulatory Visit: Payer: 59

## 2020-03-09 DIAGNOSIS — Z20822 Contact with and (suspected) exposure to covid-19: Secondary | ICD-10-CM

## 2020-03-10 LAB — SARS-COV-2, NAA 2 DAY TAT

## 2020-03-10 LAB — NOVEL CORONAVIRUS, NAA: SARS-CoV-2, NAA: NOT DETECTED

## 2020-08-09 ENCOUNTER — Other Ambulatory Visit: Payer: Self-pay | Admitting: Ophthalmology

## 2021-01-31 ENCOUNTER — Ambulatory Visit: Payer: 59 | Admitting: Podiatry

## 2021-02-04 IMAGING — MR MR PELVIS WO/W CM
13 of 27 series · 22 of 48 positions shown · IV contrast (Gadavist)
Comparison: Multiple exams, including Ultrasound of 09/20/2019

CLINICAL DATA: Right groin pain and complex cystic mass

EXAM:
MRI ABDOMEN AND PELVIS WITHOUT AND WITH CONTRAST
TECHNIQUE: Multiplanar multisequence MR imaging of the abdomen and pelvis was
performed both before and after the administration of intravenous
contrast.
CONTRAST:  10mL GADAVIST GADOBUTROL 1 MMOL/ML IV SOLN

[Series 2: T2 · coronal · 5.0mm · 1.41mm/px · 1 of 32 slices shown]
[im 1/32]
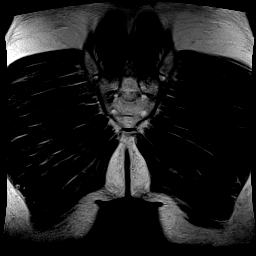

[Series 4: ax tse fs · axial · 5.0mm · 0.88mm/px · 1 of 38 slices shown]
[im 1/38]
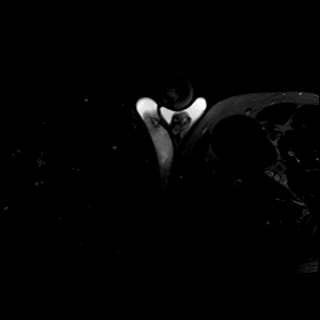

[Series 5: ax tse trig · axial · 5.0mm · 0.88mm/px · 1 of 38 slices shown]
[im 1/38]
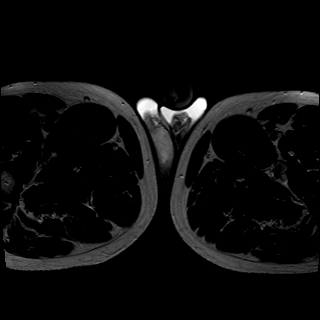

[Series 6: sag tse trig · sagittal · 5.0mm · 0.77mm/px · 1 of 38 slices shown]
[im 1/38]
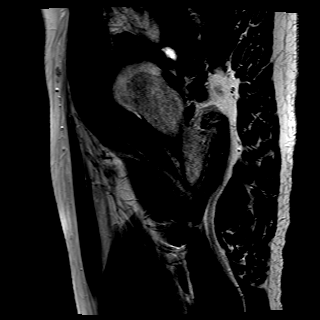

[Series 7: T1 dynamic fat-sat · axial · 1.4mm · 0.78mm/px · z∈[-473,-295]mm · 2 of 128 slices shown (1 of 2)]
[im 1/128]
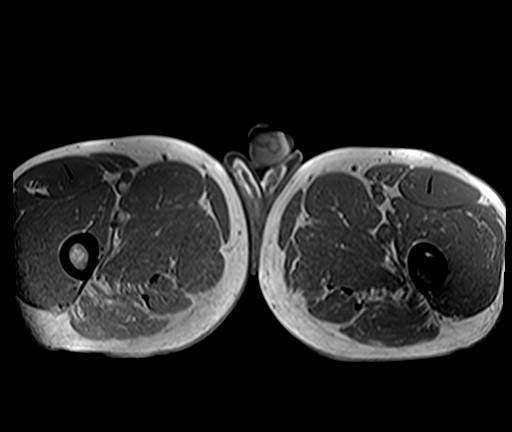
[im 128/128]
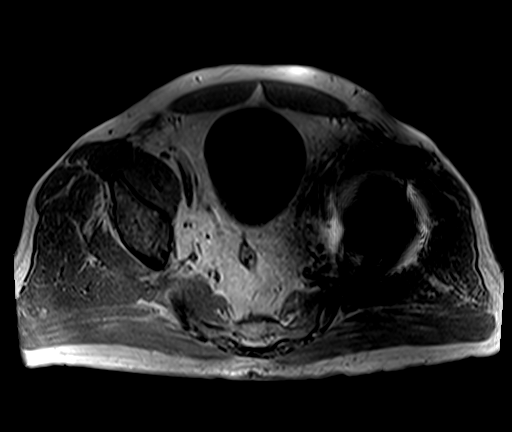

[Series 7: T1 dynamic fat-sat · axial · 1.4mm · 0.78mm/px · z∈[-473,-295]mm · 2 of 128 slices shown (2 of 2)]
[im 1/128]
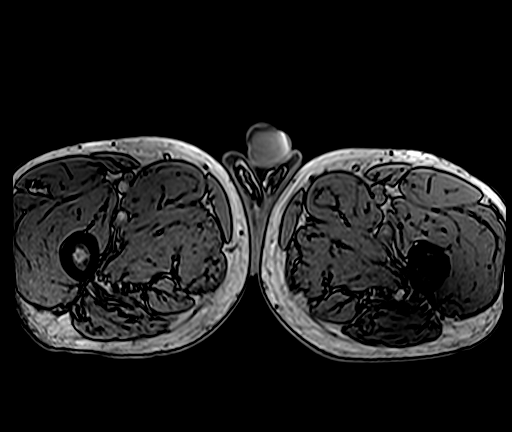
[im 128/128]
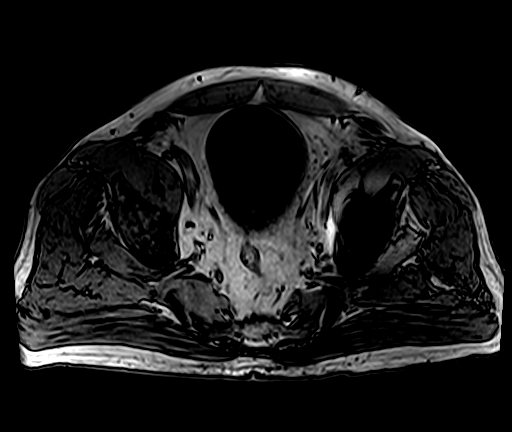

[Series 8: T1 dynamic · axial · 1.4mm · 0.78mm/px · z∈[-473,-295]mm · 2 of 128 slices shown (1 of 2)]
[im 1/128]
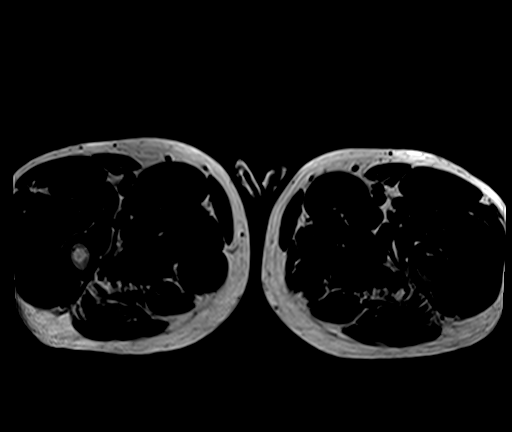
[im 128/128]
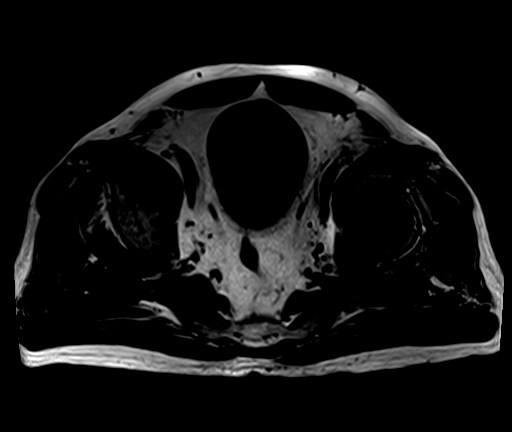

[Series 9: T1 dynamic · axial · 1.4mm · 0.78mm/px · z∈[-473,-295]mm · 2 of 128 slices shown (2 of 2)]
[im 1/128]
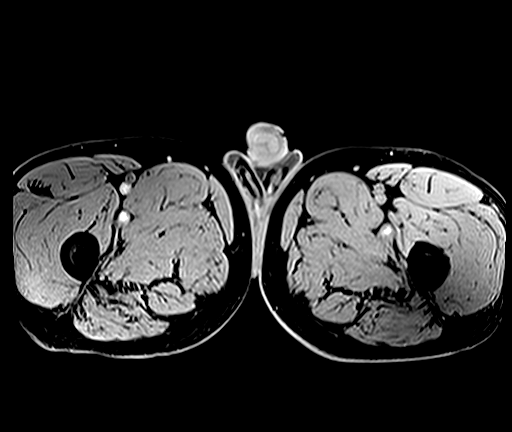
[im 128/128]
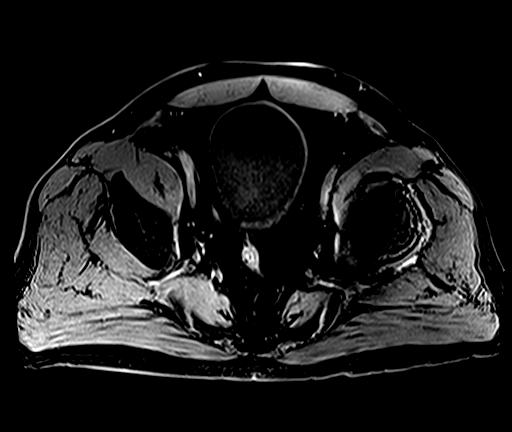

[Series 10: T1 dynamic post-contrast · axial · 1.4mm · 0.78mm/px · z∈[-473,-295]mm · 2 of 128 slices shown (1 of 5)]
[im 1/128]
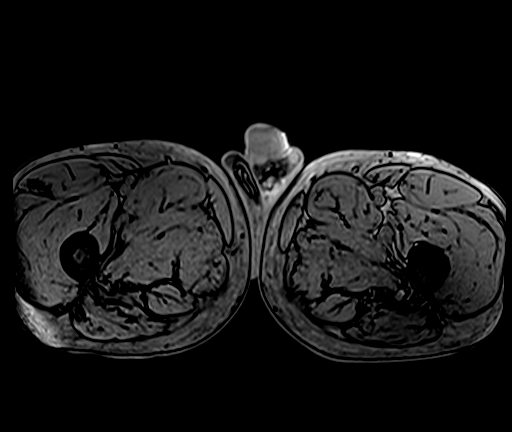
[im 128/128]
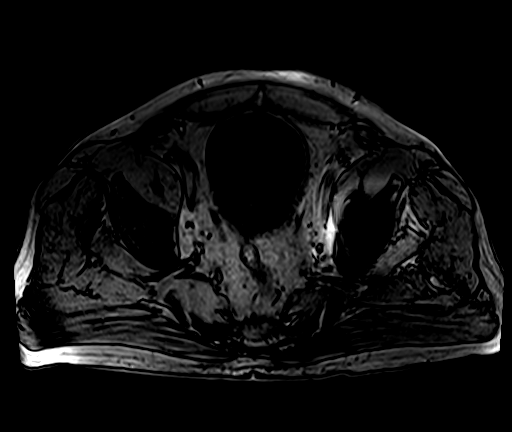

[Series 10: T1 dynamic post-contrast · axial · 1.4mm · 0.78mm/px · z∈[-473,-295]mm · 2 of 128 slices shown (2 of 5)]
[im 1/128]
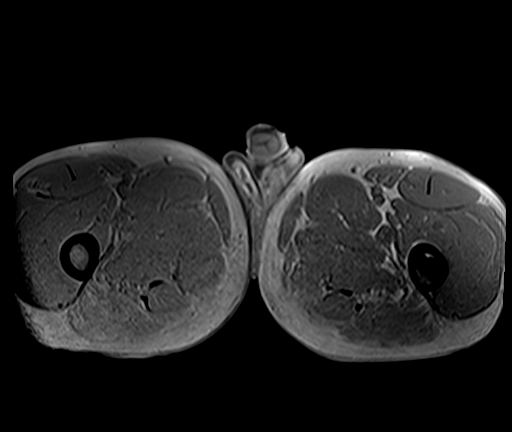
[im 128/128]
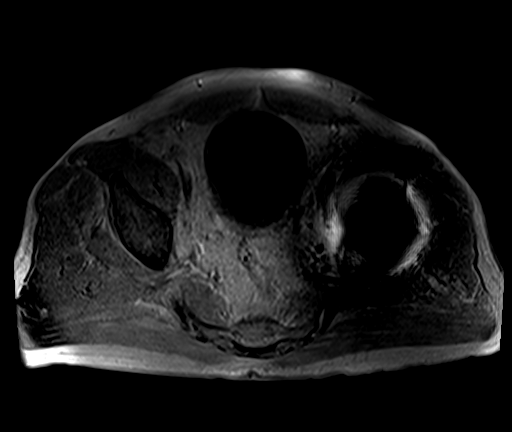

[Series 11: T1 dynamic post-contrast · axial · 1.4mm · 0.78mm/px · z∈[-473,-295]mm · 2 of 128 slices shown (3 of 5)]
[im 1/128]
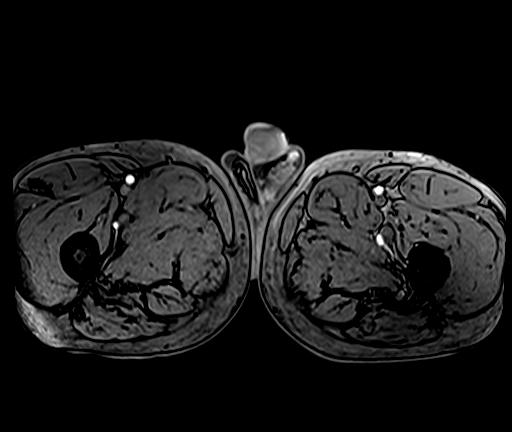
[im 128/128]
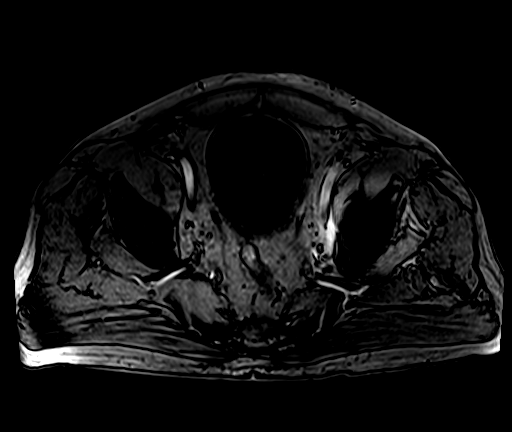

[Series 11: T1 dynamic post-contrast · axial · 1.4mm · 0.78mm/px · z∈[-473,-295]mm · 2 of 128 slices shown (4 of 5)]
[im 1/128]
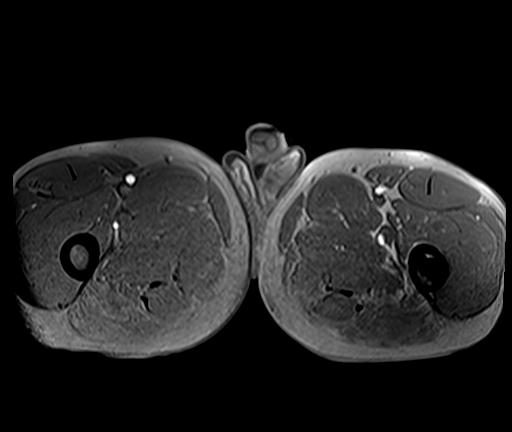
[im 128/128]
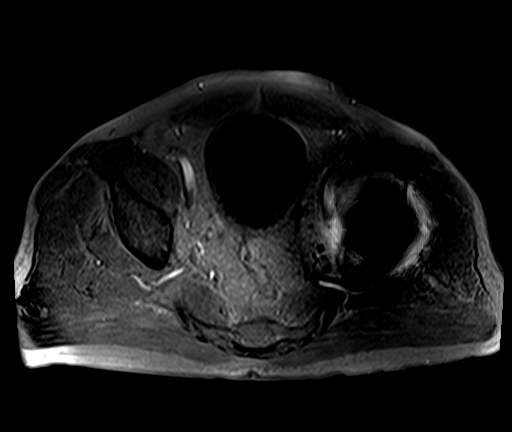

[Series 12: T1 dynamic post-contrast · axial · 1.4mm · 0.78mm/px · z∈[-473,-295]mm · 2 of 128 slices shown (5 of 5)]
[im 1/128]
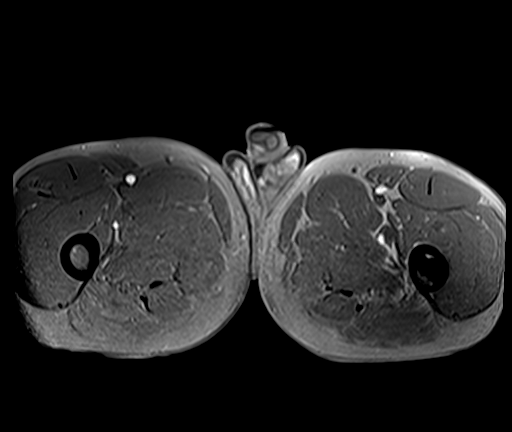
[im 128/128]
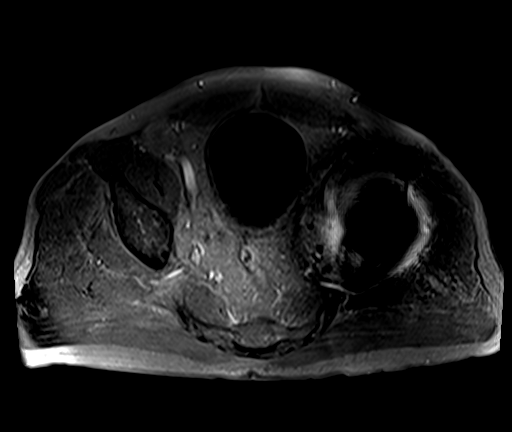

[22 of 48 positions shown; findings below may reference images not displayed]

FINDINGS: COMBINED FINDINGS FOR BOTH MR ABDOMEN AND PELVIS

Lower chest: Unremarkable

Hepatobiliary: Tiny dependent filling defects in the gallbladder
likely represent very small gallstones. No biliary dilatation. There
is a small hepatic cyst in segment 2 of the liver and a small
hepatic cyst in segment 5 of the liver. Otherwise unremarkable.

Pancreas:  Unremarkable

Spleen:  Unremarkable

Adrenals/Urinary Tract: 2.9 by 2.4 cm mass of the right adrenal
gland on image [DATE] demonstrates diffuse dropout of signal on out of
phase images compatible with adrenal adenoma.

Bosniak category IIF lesion of the left kidney lower pole measures
1.7 by 0.8 cm on image 33/10 and has fluid density cystic elements
but also internal linear enhancing septations which warrant follow
up.

The urinary bladder appears unremarkable.

Stomach/Bowel: There is a diverticulum of the transverse duodenum.
Otherwise unremarkable.

Vascular/Lymphatic: Mild abdominal aortic atherosclerotic vascular
calcification.

The right inguinal lesion of concern measures 3.0 by 1.8 by 2.8 cm
and demonstrates intermediate T2 and intermediate T1 signal
characteristics and diffuse enhancement. This likely represents an
enlarged inguinal lymph node. This is well separate from the
spermatic cord and sits within the subcutaneous tissues volar to the
proximal superficial femoral artery for example on image 90/7. This
lymph node was likely present on image 97/2 of the 03/01/2013 exam,
but had normal morphology at that time, only measuring 0.6 cm in
short axis and with a fatty hilum.

I do not observe other enlarged pelvic lymph nodes. However, there
are numerous scattered small mesenteric lymph nodes in the upper
abdomen, along with chronic stranding in central mesentery of the
upper abdomen as can be encountered in the setting of sclerosing
mesenteritis or sometimes in the setting of lymphoma. On the prior
exam from 12/20/2013, this same appearance in the central mesentery
was present, with fatty halo around the lymph nodes which would tend
to favor sclerosing mesenteritis.

Reproductive: There is evidence of benign prostatic hypertrophy.
Today's exam was not performed as a dedicated prostate MRI
examination. Small bilateral scrotal hydroceles.

Other:  No supplemental non-categorized findings.

Musculoskeletal: Metal artifact from left hip prosthesis.
IMPRESSION: 1. A right inguinal lymph node is pathologically enlarged with short
axis diameter of 1.8 cm and diffuse enhancement. Possibilities may
include reactive adenopathy or malignant adenopathy, for example in
the setting of lymphoma. Tenderness associated with such lymph nodes
is often considered in it indicator of a benign reactive etiology
although this is not absolute, and if the lymph node fails to
resolve spontaneously, biopsy may be warranted.
2. Chronic stranding in the central upper abdominal mesentery with
scattered small mesenteric lymph nodes. This is unchanged from 6094
and has a pattern favoring chronic sclerosing mesenteritis although
a similar pattern can occasionally be encountered in the setting of
lymphoma.
3. Small right adrenal adenoma.
4. Bosniak category IIF cystic lesion of the left kidney lower pole.
This complex cyst has some internal septations and is probably
benign but warrants surveillance. Follow up renal protocol MRI is
recommended in 6 months time. This recommendation follows ACR
consensus guidelines: Management of the Incidental Renal Mass on CT:
A White Paper of the ACR Incidental Findings Committee. [HOSPITAL] 4042;[DATE].
5. Benign prostatic hypertrophy.
6. Suspected small gallstones.
7.  Aortic Atherosclerosis (WOX5Q-V00.0).

## 2021-03-02 ENCOUNTER — Other Ambulatory Visit: Payer: Self-pay | Admitting: Internal Medicine

## 2021-03-02 DIAGNOSIS — R011 Cardiac murmur, unspecified: Secondary | ICD-10-CM

## 2021-03-12 ENCOUNTER — Ambulatory Visit: Payer: 59

## 2021-03-12 ENCOUNTER — Other Ambulatory Visit: Payer: Self-pay

## 2021-03-12 DIAGNOSIS — R011 Cardiac murmur, unspecified: Secondary | ICD-10-CM

## 2021-04-01 ENCOUNTER — Encounter (HOSPITAL_BASED_OUTPATIENT_CLINIC_OR_DEPARTMENT_OTHER): Payer: Self-pay | Admitting: *Deleted

## 2021-04-01 ENCOUNTER — Emergency Department (HOSPITAL_BASED_OUTPATIENT_CLINIC_OR_DEPARTMENT_OTHER)
Admission: EM | Admit: 2021-04-01 | Discharge: 2021-04-01 | Disposition: A | Discharge status: Home / Self Care | Payer: 59 | Attending: Emergency Medicine | Admitting: Emergency Medicine

## 2021-04-01 ENCOUNTER — Other Ambulatory Visit: Payer: Self-pay

## 2021-04-01 ENCOUNTER — Emergency Department (HOSPITAL_BASED_OUTPATIENT_CLINIC_OR_DEPARTMENT_OTHER): Payer: 59

## 2021-04-01 DIAGNOSIS — M542 Cervicalgia: Secondary | ICD-10-CM | POA: Diagnosis not present

## 2021-04-01 DIAGNOSIS — I10 Essential (primary) hypertension: Secondary | ICD-10-CM | POA: Insufficient documentation

## 2021-04-01 DIAGNOSIS — J45909 Unspecified asthma, uncomplicated: Secondary | ICD-10-CM | POA: Insufficient documentation

## 2021-04-01 DIAGNOSIS — R519 Headache, unspecified: Secondary | ICD-10-CM | POA: Diagnosis present

## 2021-04-01 DIAGNOSIS — Z79899 Other long term (current) drug therapy: Secondary | ICD-10-CM | POA: Diagnosis not present

## 2021-04-01 DIAGNOSIS — Z20822 Contact with and (suspected) exposure to covid-19: Secondary | ICD-10-CM | POA: Diagnosis not present

## 2021-04-01 LAB — DIFFERENTIAL
Abs Immature Granulocytes: 0.06 10*3/uL (ref 0.00–0.07)
Basophils Absolute: 0 10*3/uL (ref 0.0–0.1)
Basophils Relative: 0 %
Eosinophils Absolute: 0.2 10*3/uL (ref 0.0–0.5)
Eosinophils Relative: 3 %
Immature Granulocytes: 1 %
Lymphocytes Relative: 30 %
Lymphs Abs: 1.6 10*3/uL (ref 0.7–4.0)
Monocytes Absolute: 0.5 10*3/uL (ref 0.1–1.0)
Monocytes Relative: 9 %
Neutro Abs: 3 10*3/uL (ref 1.7–7.7)
Neutrophils Relative %: 57 %

## 2021-04-01 LAB — ETHANOL: Alcohol, Ethyl (B): 26 mg/dL — ABNORMAL HIGH (ref <10)

## 2021-04-01 LAB — COMPREHENSIVE METABOLIC PANEL
ALT: 18 U/L (ref 0–44)
AST: 19 U/L (ref 15–41)
Albumin: 4.4 g/dL (ref 3.5–5.0)
Alkaline Phosphatase: 53 U/L (ref 38–126)
Anion gap: 11 (ref 5–15)
BUN: 17 mg/dL (ref 8–23)
CO2: 24 mmol/L (ref 22–32)
Calcium: 9.3 mg/dL (ref 8.9–10.3)
Chloride: 105 mmol/L (ref 98–111)
Creatinine, Ser: 1.23 mg/dL (ref 0.61–1.24)
GFR, Estimated: >60 mL/min (ref >60)
Glucose, Bld: 147 mg/dL — ABNORMAL HIGH (ref 70–99)
Potassium: 4 mmol/L (ref 3.5–5.1)
Sodium: 140 mmol/L (ref 135–145)
Total Bilirubin: 0.4 mg/dL (ref 0.3–1.2)
Total Protein: 6.5 g/dL (ref 6.5–8.1)

## 2021-04-01 LAB — CBC
HCT: 41.1 % (ref 39.0–52.0)
Hemoglobin: 14 g/dL (ref 13.0–17.0)
MCH: 31.3 pg (ref 26.0–34.0)
MCHC: 34.1 g/dL (ref 30.0–36.0)
MCV: 91.7 fL (ref 80.0–100.0)
Platelets: 163 10*3/uL (ref 150–400)
RBC: 4.48 MIL/uL (ref 4.22–5.81)
RDW: 12.9 % (ref 11.5–15.5)
WBC: 5.3 10*3/uL (ref 4.0–10.5)
nRBC: 0 % (ref 0.0–0.2)

## 2021-04-01 LAB — APTT: aPTT: 27 seconds (ref 24–36)

## 2021-04-01 LAB — PROTIME-INR
INR: 0.9 (ref 0.8–1.2)
Prothrombin Time: 12.6 seconds (ref 11.4–15.2)

## 2021-04-01 LAB — URINALYSIS, ROUTINE W REFLEX MICROSCOPIC
Bilirubin Urine: NEGATIVE
Glucose, UA: 250 mg/dL — AB
Leukocytes,Ua: NEGATIVE
Nitrite: NEGATIVE
Specific Gravity, Urine: 1.024 (ref 1.005–1.030)
pH: 5.5 (ref 5.0–8.0)

## 2021-04-01 LAB — RAPID URINE DRUG SCREEN, HOSP PERFORMED
Amphetamines: NOT DETECTED
Barbiturates: NOT DETECTED
Benzodiazepines: NOT DETECTED
Cocaine: NOT DETECTED
Opiates: NOT DETECTED
Tetrahydrocannabinol: NOT DETECTED

## 2021-04-01 LAB — RESP PANEL BY RT-PCR (FLU A&B, COVID) ARPGX2
Influenza A by PCR: NEGATIVE
Influenza B by PCR: NEGATIVE
SARS Coronavirus 2 by RT PCR: NEGATIVE

## 2021-04-01 MED ORDER — FENTANYL CITRATE PF 50 MCG/ML IJ SOSY
50.0000 ug | PREFILLED_SYRINGE | Freq: Once | INTRAMUSCULAR | Status: DC
Start: 2021-04-01 — End: 2021-04-01
  Filled 2021-04-01: qty 1

## 2021-04-01 MED ORDER — IOHEXOL 350 MG/ML SOLN
75.0000 mL | Freq: Once | INTRAVENOUS | Status: AC | PRN
Start: 2021-04-01 — End: 2021-04-01
  Administered 2021-04-01: 100 mL via INTRAVENOUS

## 2021-04-01 NOTE — ED Triage Notes (Addendum)
Pt states that he went to bed around 2300. Woke up around 2330 with a headache to the right front area that radiates into his neck. Denies a hx of the same. States his right lower lip area feels a little numb. Pt states he felt fine prior to going to bed.  Pt with equal grips bilateral, speech clear, pupils equal and reactive. Alert and oriented times 3. Pt has drank some etoh tonight. Took tylenol pta.

## 2021-04-01 NOTE — ED Notes (Signed)
MD at bedside. 

## 2021-04-01 NOTE — ED Provider Notes (Signed)
[facility] EMERGENCY DEPT Provider Note  CSN: [id number] Arrival date & time: 04/01/21 0104  Chief Complaint(s) Headache  HPI Mike Jenkins is a 66 y.o. male with a past medical history listed below including follicular lymphoma here for severe headache that woke him up from sleep at 11:30 PM.  Pain is right-sided face and neck.  Reports numbness to small patch of the right lower lip.  Denies any visual changes, extremity weakness, slurred speech.   Reports that he had a few alcoholic drinks tonight.  Reports a remote history of migraine headaches as a teenager.   Headache  Past Medical History Past Medical History:  Diagnosis Date   Asthma    Colon polyps    hyperplastic   Diverticulosis    Heart murmur    since he was a baby-echo done preop hip surg 2003-   Hypertension    Lymphadenopathy, inguinal    right   Melanoma [facility])    Patient Active Problem List   Diagnosis Date Noted   Post-tonsillectomy hemorrhage 11/09/2019   At risk for bleeding associated with tonsillectomy and adenoidectomy 11/09/2019   Tonsil asymmetry 11/04/2019   Dyspnea on exertion 11/18/2018   Home Medication(s) Prior to Admission medications   Medication Sig Start Date End Date Taking? Authorizing Provider  pantoprazole (PROTONIX) 40 MG tablet Take 40 mg by mouth daily.   Yes [provider]  LORazepam (ATIVAN) 1 MG tablet Take 1 tablet by mouth at bedtime as needed. 09/11/18   [provider]  losartan (COZAAR) 50 MG tablet Take 50 mg by mouth daily. Taking 2 tablets daily for total of 100 mg.    [provider]                                                                                                                                    Allergies Patient has no known allergies.  Review of Systems Review of Systems  Neurological:  Positive for headaches.  As noted in HPI  Physical Exam Vital Signs  I have reviewed the triage vital signs BP (!)  138/93    Pulse 65    Temp 97.7 F (36.5 C) (Oral)    Resp 16    Ht 6\' 2"  (1.88 m)    Wt 102.1 kg    SpO2 99%    BMI 28.89 kg/m   Physical Exam Vitals reviewed.  Constitutional:      General: He is not in acute distress.    Appearance: He is well-developed. He is not diaphoretic.  HENT:     Head: Normocephalic and atraumatic.     Right Ear: External ear normal.     Left Ear: External ear normal.     Nose: Nose normal.     Mouth/Throat:     Mouth: Mucous membranes are moist.  Eyes:     General: No scleral icterus.    Conjunctiva/sclera: Conjunctivae normal.  Neck:     Trachea: Phonation normal.   Cardiovascular:     Rate and Rhythm: Normal rate and regular rhythm.  Pulmonary:     Effort: Pulmonary effort is normal. No respiratory distress.     Breath sounds: No stridor.  Abdominal:     General: There is no distension.  Musculoskeletal:        General: Normal range of motion.     Cervical back: Normal range of motion. No rigidity. Muscular tenderness present. Normal range of motion.  Lymphadenopathy:     Cervical: Cervical adenopathy present.     Right cervical: Deep cervical adenopathy (baseline from lymphoma) present.  Neurological:     Mental Status: He is alert and oriented to person, place, and time.     Comments: Mental Status:  Alert and oriented to person, place, and time.  Attention and concentration normal.  Speech clear.  Recent memory is intact  Cranial Nerves:  II Visual Fields: Intact to confrontation. Visual fields intact. III, IV, VI: Pupils equal and reactive to light and near. Full eye movement without nystagmus  V Facial Sensation: decreased sensation to small area just below the right lower lip (but intact in rest of V3 distribution on right) otherwise normal sensation. No weakness of masticatory muscles  VII: No facial weakness or asymmetry  VIII Auditory Acuity: Grossly normal  IX/X: The uvula is midline; the palate elevates symmetrically  XI:  Normal sternocleidomastoid and trapezius strength  XII: The tongue is midline. No atrophy or fasciculations.   Motor System: Muscle Strength: 5/5 and symmetric in the upper and lower extremities. No pronation or drift.  Muscle Tone: Tone and muscle bulk are normal in the upper and lower extremities.  Coordination: Intact finger-to-nose. No tremor.  Sensation: Intact to light touch. Gait: Routine gait normal.   Psychiatric:        Behavior: Behavior normal.    ED Results and Treatments Labs (all labs ordered are listed, but only abnormal results are displayed) Labs Reviewed  ETHANOL - Abnormal; Notable for the following components:      Result Value   Alcohol, Ethyl (B) 26 (*)    All other components within normal limits  COMPREHENSIVE METABOLIC PANEL - Abnormal; Notable for the following components:   Glucose, Bld 147 (*)    All other components within normal limits  URINALYSIS, ROUTINE W REFLEX MICROSCOPIC - Abnormal; Notable for the following components:   Glucose, UA 250 (*)    Hgb urine dipstick TRACE (*)    Ketones, ur TRACE (*)    Protein, ur TRACE (*)    All other components within normal limits  RESP PANEL BY RT-PCR (FLU A&B, COVID) ARPGX2  PROTIME-INR  APTT  CBC  DIFFERENTIAL  RAPID URINE DRUG SCREEN, HOSP PERFORMED                                                                                                                         EKG  EKG  Interpretation  Date/Time:  Sunday April 01 2021 01:40:23 EST Ventricular Rate:  68 PR Interval:  220 QRS Duration: 114 QT Interval:  427 QTC Calculation: 455 R Axis:   -60 Text Interpretation: Sinus rhythm Prolonged PR interval Left anterior fascicular block Left ventricular hypertrophy No acute changes Confirmed by Addison Lank [phone]) on 04/01/2021 4:31:18 AM       Radiology CT ANGIO HEAD NECK W WO CM  Result Date: 04/01/2021 CLINICAL DATA:  Initial evaluation for acute headache. EXAM: CT ANGIOGRAPHY HEAD  AND NECK TECHNIQUE: Multidetector CT imaging of the head and neck was performed using the standard protocol during bolus administration of intravenous contrast. Multiplanar CT image reconstructions and MIPs were obtained to evaluate the vascular anatomy. Carotid stenosis measurements (when applicable) are obtained utilizing NASCET criteria, using the distal internal carotid diameter as the denominator. RADIATION DOSE REDUCTION: This exam was performed according to the departmental dose-optimization program which includes automated exposure control, adjustment of the mA and/or kV according to patient size and/or use of iterative reconstruction technique. CONTRAST:  138mL OMNIPAQUE IOHEXOL 350 MG/ML SOLN COMPARISON:  Noncontrast head CT performed the same day as well as prior neck CT from 10/06/2019. FINDINGS: CTA NECK FINDINGS Aortic arch: Visualized aortic arch normal in caliber with normal branch pattern. Minimal plaque within the arch itself. No stenosis about the origin of the great vessels. Right carotid system: Right common carotid artery patent from its origin to the bifurcation without stenosis. Mild eccentric calcified plaque at the origin of the cervical right ICA without significant stenosis. Right ICA patent distally without stenosis or dissection. Left carotid system: Left CCA patent from its origin to the bifurcation without stenosis. Eccentric calcified plaque about the left carotid bulb/proximal left ICA without hemodynamically significant stenosis. Left ICA patent distally without stenosis or dissection. Vertebral arteries: Left vertebral arteries arise from subclavian arteries. No proximal subclavian artery stenosis. Vertebral arteries patent without stenosis or dissection. Skeleton: Visualized osseous structures demonstrate a somewhat heterogeneous appearance, stable as compared to previous exam. No new discrete or worrisome osseous lesions. Other neck: Bulky right level II lymph node/nodal  conglomerate measures approximately 3.6 x 5.5 cm (series 6, image 74). Lesion compresses the traversing right internal jugular vein. 9 mm nodular density at the inferior left parotid gland, indeterminate, but increased from prior. Upper chest: Visualized upper chest demonstrates no acute finding. Review of the MIP images confirms the above findings CTA HEAD FINDINGS Anterior circulation: Both internal carotid arteries widely patent to the termini without stenosis or other abnormality. A1 segments patent bilaterally. Azygous ACA widely patent to its distal aspect. Right M1 segment widely patent. Left M1 bifurcates early and is widely patent as well. No proximal MCA branch occlusion. Distal MCA branches well perfused and symmetric. Posterior circulation: Both V4 segments patent without stenosis. Left vertebral artery dominant. Both PICA patent. Basilar patent to its distal aspect without stenosis. Superior cerebellar and posterior cerebral arteries widely patent bilaterally. Venous sinuses: Patent allowing timing the contrast bolus. Anatomic variants: As above. No intracranial aneurysm or other vascular abnormality. Review of the MIP images confirms the above findings IMPRESSION: 1. Negative CTA of the head and neck. No large vessel or other acute vascular abnormality. No aneurysm. 2. Mild for age atheromatous change about the carotid bifurcations without hemodynamically significant stenosis. 3. 3.6 x 5.5 cm bulky right level II lymph node/nodal conglomerate. Finding most likely related to patient's history of lymphoma. This is increased/worsened as compared to prior neck CT from 10/06/2019. 4. 9 mm  nodular density at the inferior left parotid gland, indeterminate, but favored to reflect a small intraparotid lymph node. A possible primary salivary lesion is not excluded. Attention at follow-up recommended. Electronically Signed   By: Jeannine Boga M.D.   On: 04/01/2021 04:16   CT HEAD WO CONTRAST  Result  Date: 04/01/2021 CLINICAL DATA:  Headaches EXAM: CT HEAD WITHOUT CONTRAST TECHNIQUE: Contiguous axial images were obtained from the base of the skull through the vertex without intravenous contrast. RADIATION DOSE REDUCTION: This exam was performed according to the departmental dose-optimization program which includes automated exposure control, adjustment of the mA and/or kV according to patient size and/or use of iterative reconstruction technique. COMPARISON:  None. FINDINGS: Brain: No evidence of acute infarction, hemorrhage, hydrocephalus, extra-axial collection or mass lesion/mass effect. Vascular: No hyperdense vessel or unexpected calcification. Skull: Normal. Negative for fracture or focal lesion. Sinuses/Orbits: No acute finding. Other: None. IMPRESSION: No acute intracranial abnormality noted. Electronically Signed   By: Inez Catalina M.D.   On: 04/01/2021 03:21    Pertinent labs & imaging results that were available during my care of the patient were reviewed by me and considered in my medical decision making (see MDM for details).  Medications Ordered in ED Medications  fentaNYL (SUBLIMAZE) injection 50 mcg (50 mcg Intravenous Not Given 04/01/21 0200)  iohexol (OMNIPAQUE) 350 MG/ML injection 75 mL (100 mLs Intravenous Contrast Given 04/01/21 0301)                                                                                                                                     Procedures Procedures  (including critical care time)  Medical Decision Making / ED Course        Severe headache Subarachnoid, ICH, carotid dissection, migraine headache, muscle spasm of the right neck with occipital neuralgia.  Lower suspicion for CVA. Not consistent with meningitis.  Work-up ordered to assess concerns above.  Labs and imaging independently interpreted by me and noted below: No leukocytosis or anemia.   No significant electrolyte derangements or renal sufficiency. CT Noncon of the head  negative for ICH or subarachnoid hemorrhage. CT angio without obvious large vessel occlusion, aneurysm, or dissection.  Radiology confirmed the findings and also noted lymphadenopathy.  Management: IV Fentanyl for pain  Reassessment: Just prior to receiving the fentanyl dose, patient reported complete resolution of his headache and symptoms. Remained hemodynamically stable without recurrence of symptoms.   Final Clinical Impression(s) / ED Diagnoses Final diagnoses:  Bad headache   The patient appears reasonably screened and/or stabilized for discharge and I doubt any other medical condition or other [facility] requiring further screening, evaluation, or treatment in the ED at this time prior to discharge. Safe for discharge with strict return precautions.  Disposition: Discharge  Condition: Good  I have discussed the results, Dx and Tx plan with the patient/family who expressed understanding and agree(s) with the plan. Discharge instructions discussed at  length. The patient/family was given strict return precautions who verbalized understanding of the instructions. No further questions at time of discharge.    ED Discharge Orders     None       Follow Up: Burnard Bunting, MD [place] [address] [phone]  Call  to schedule an appointment for close follow up            This chart was dictated using voice recognition software.  Despite best efforts to proofread,  errors can occur which can change the documentation meaning.    Fatima Blank, MD 04/01/21 [phone]

## 2021-04-02 ENCOUNTER — Other Ambulatory Visit: Payer: Self-pay | Admitting: Internal Medicine

## 2021-04-02 ENCOUNTER — Ambulatory Visit
Admission: RE | Admit: 2021-04-02 | Discharge: 2021-04-02 | Disposition: A | Discharge status: Home / Self Care | Payer: 59 | Source: Ambulatory Visit | Attending: Internal Medicine | Admitting: Internal Medicine

## 2021-04-02 DIAGNOSIS — R200 Anesthesia of skin: Secondary | ICD-10-CM

## 2021-04-02 DIAGNOSIS — R519 Headache, unspecified: Secondary | ICD-10-CM

## 2021-04-02 MED ORDER — GADOBENATE DIMEGLUMINE 529 MG/ML IV SOLN
20.0000 mL | Freq: Once | INTRAVENOUS | Status: AC | PRN
  Administered 2021-04-02: 20 mL via INTRAVENOUS

## 2021-04-10 ENCOUNTER — Encounter: Payer: Self-pay | Admitting: *Deleted

## 2021-04-11 ENCOUNTER — Encounter: Payer: Self-pay | Admitting: Psychiatry

## 2021-04-11 ENCOUNTER — Ambulatory Visit: Payer: 59 | Admitting: Psychiatry

## 2021-04-11 VITALS — BP 139/87 | HR 65 | Ht 74.0 in | Wt 227.8 lb

## 2021-04-11 DIAGNOSIS — G43109 Migraine with aura, not intractable, without status migrainosus: Secondary | ICD-10-CM | POA: Diagnosis not present

## 2021-04-11 DIAGNOSIS — R200 Anesthesia of skin: Secondary | ICD-10-CM | POA: Diagnosis not present

## 2021-04-11 MED ORDER — KETOROLAC TROMETHAMINE 60 MG/2ML IM SOLN
60.0000 mg | Freq: Once | INTRAMUSCULAR | Status: AC
  Administered 2021-04-11: 60 mg via INTRAMUSCULAR

## 2021-04-11 MED ORDER — RIZATRIPTAN BENZOATE 10 MG PO TABS: 10.0000 mg | ORAL_TABLET | ORAL | 2 refills | Status: AC | PRN

## 2021-04-11 NOTE — Patient Instructions (Signed)
Start Maxalt 10 mg PRN. Take at the onset of migraine. If headache recurs or does not fully resolve, you may take a second dose after 2 hours. Please avoid taking more than 2 days per week to avoid rebound headaches ? ? ?

## 2021-04-11 NOTE — Progress Notes (Signed)
Referring:  Burnard Bunting, MD [address] [place],  [place] [address]  PCP: Burnard Bunting, MD  Neurology was asked to evaluate Mike Jenkins, a 66 year old male for a chief complaint of headaches.  Our recommendations of care will be communicated by shared medical record.    CC:  headaches  HPI:  Medical co-morbidities: follicular lymphoma, HTN, migraines  The patient presents for evaluation of headaches and numbness. He developed a severe headache and numbness in his right chin on 04/01/21.  Headache was described as right frontal throbbing which was severe enough he had to lie down. He denies photophobia, phonophobia, or nausea at the time. He presented to the ED where MRI brain with contrast was normal. CTA head/neck showed normal blood vessels. It also demonstrated an enlarged right cervical lymph node, worsened from prior CT in 2021. His headache and numbness both resolved after about 4 hours without medication.  He was symptom free until one week ago when severe headache and numbness returned. He was started on prednisone and gabapentin 04/05/21, which has reduced severity of his headache to 2-4/10. Even with medication he has not had any headache free days since headache reoccurred. Right chin numbness has been constant since this current headache began.  Saw two dentists who did a CT scan of the jaw, which was reportedly unremarkable. Report is not available for review today.  Had migraines as a teenager, has not had any as an adult.  Headache History: Onset: February 19th Triggers: none Aura: right face numbness Location: right frontal Quality/Description: throbbing Severity: 5-10/10 Associated Symptoms:  Photophobia: no  Phonophobia: no  Nausea: no Worse with activity?: yes Duration of headaches: several days  Headache days per month: 7 Headache free days per month: 23  Current Treatment: Abortive hydrocodone  Preventative Gabapentin 100 mg TID  Prior  Therapies                                 Robaxin 500 mg PRN Prednisone taper - helped Losartan 50 mg daily Gabapentin 100 mg TID Hydrocodone PRN   LABS: CBC    Component Value Date/Time   WBC 5.3 04/01/2021 0152   RBC 4.48 04/01/2021 0152   HGB 14.0 04/01/2021 0152   HCT 41.1 04/01/2021 0152   PLT 163 04/01/2021 0152   MCV 91.7 04/01/2021 0152   MCH 31.3 04/01/2021 0152   MCHC 34.1 04/01/2021 0152   RDW 12.9 04/01/2021 0152   LYMPHSABS 1.6 04/01/2021 0152   MONOABS 0.5 04/01/2021 0152   EOSABS 0.2 04/01/2021 0152   BASOSABS 0.0 04/01/2021 0152   CMP Latest Ref Rng & Units 04/01/2021 11/17/2019 11/09/2019  Glucose 70 - 99 mg/dL 147(H) 105(H) 209(H)  BUN 8 - 23 mg/dL 17 13 28(H)  Creatinine 0.61 - 1.24 mg/dL 1.23 1.04 1.20  Sodium 135 - 145 mmol/L 140 142 136  Potassium 3.5 - 5.1 mmol/L 4.0 3.9 4.2  Chloride 98 - 111 mmol/L 105 106 100  CO2 22 - 32 mmol/L 24 29 -  Calcium 8.9 - 10.3 mg/dL 9.3 9.7 -  Total Protein 6.5 - 8.1 g/dL 6.5 7.1 -  Total Bilirubin 0.3 - 1.2 mg/dL 0.4 0.5 -  Alkaline Phos 38 - 126 U/L 53 54 -  AST 15 - 41 U/L 19 13(L) -  ALT 0 - 44 U/L 18 17 -     IMAGING:  MRI brain 04/02/21:  Normal brain MRI, with  no acute intracranial pathology.  CTA head/neck 04/01/21: 1. Negative CTA of the head and neck. No large vessel or other acute vascular abnormality. No aneurysm. 2. Mild for age atheromatous change about the carotid bifurcations without hemodynamically significant stenosis. 3. 3.6 x 5.5 cm bulky right level II lymph node/nodal conglomerate. Finding most likely related to patient's history of lymphoma. This is increased/worsened as compared to prior neck CT from 10/06/2019. 4. 9 mm nodular density at the inferior left parotid gland, indeterminate, but favored to reflect a small intraparotid lymph node. A possible primary salivary lesion is not excluded. Attention at follow-up recommended.  Imaging independently reviewed on April 11, 2021    Current Outpatient Medications on File Prior to Visit  Medication Sig Dispense Refill   amoxicillin (AMOXIL) 500 MG capsule Take 500 mg by mouth 3 (three) times daily.     gabapentin (NEURONTIN) 100 MG capsule Take 100 mg by mouth 3 (three) times daily as needed.     HYDROcodone-acetaminophen (NORCO/VICODIN) 5-325 MG tablet Take 2 tablets by mouth every 6 (six) hours.     losartan (COZAAR) 50 MG tablet Take 50 mg by mouth daily. Taking 2 tablets daily for total of 100 mg.     methocarbamol (ROBAXIN) 500 MG tablet Take 500 mg by mouth 3 (three) times daily.     pantoprazole (PROTONIX) 40 MG tablet Take 40 mg by mouth daily.     predniSONE (STERAPRED UNI-PAK 21 TAB) 10 MG (21) TBPK tablet See admin instructions. follow package directions     traZODone (DESYREL) 50 MG tablet Take 50 mg by mouth at bedtime as needed.     No current facility-administered medications on file prior to visit.     Allergies: No Known Allergies  Family History: Migraine or other headaches in the family:  sister Aneurysms in a first degree relative:  none Brain tumors in the family:  none Other neurological illness in the family:   none  Past Medical History: Past Medical History:  Diagnosis Date   Asthma    Colon polyps    hyperplastic   Diverticulosis    Headache    Heart murmur    since he was a baby-echo done preop hip surg 2003-   Hypertension    Hypertension    Insomnia    Lymphadenopathy, inguinal    right   Lymphoma ([facility])    Melanoma ([facility])     Past Surgical History Past Surgical History:  Procedure Laterality Date   COLONOSCOPY     I & D EXTREMITY  12/26/2011   Procedure: IRRIGATION AND DEBRIDEMENT EXTREMITY;  Surgeon: Tennis Must, MD;  Location: [facility];  Service: Orthopedics;  Laterality: Left;   KNEE ARTHROSCOPY Bilateral 1995, 1998   right and left   MASS EXCISION  12/26/2011   Procedure: EXCISION MASS;  Surgeon: Tennis Must, MD;  Location: [facility];  Service: Orthopedics;  Laterality: Left;  LEFT SMALL EXCISION MASS & DEBRIDEMENT DIP JOINT   MELANOMA EXCISION Left 2013   POLYPECTOMY     tonsillar cauterization  11/09/2019   Tonsillar Cauterization (N/A Throat)   TONSILLECTOMY Right 11/04/2019   Procedure: TONSILLECTOMY;  Surgeon: Jerrell Belfast, MD;  Location: [facility];  Service: ENT;  Laterality: Right;   TONSILLECTOMY N/A 11/09/2019   Procedure: Tonsillar Cauterization;  Surgeon: Jerrell Belfast, MD;  Location: [facility];  Service: ENT;  Laterality: N/A;   TOTAL HIP ARTHROPLASTY Left 2003    Social History:  Social History   Tobacco Use   Smoking status: Former   Smokeless tobacco: Never  Scientific laboratory technician Use: Never used  Substance Use Topics   Alcohol use: Yes    Comment: occassional   Drug use: No    ROS: Negative for fevers, chills. Positive for headaches, numbness. All other systems reviewed and negative unless stated otherwise in HPI.   Physical Exam:   Vital Signs: BP 139/87    Pulse 65    Ht 6\' 2"  (1.88 m)    Wt 227 lb 12.8 oz (103.3 kg)    BMI 29.25 kg/m  GENERAL: well appearing,in no acute distress,alert SKIN:  Color, texture, turgor normal. No rashes or lesions HEAD:  Normocephalic/atraumatic. CV:  RRR RESP: Normal respiratory effort MSK: +tenderness to palpation over right occiput, neck, and shoulders  NEUROLOGICAL: Mental Status: Alert, oriented to person, place and time,Follows commands Cranial Nerves: PERRL, visual fields intact to confrontation, extraocular movements intact, diminished sensation right V3 from mid-chin to corner of mouth in distribution of mental nerve, no facial droop or ptosis, hearing grossly intact, no dysarthria Motor: muscle strength 5/5 both upper and lower extremities,no drift, normal tone Reflexes: 2+ throughout Sensation: intact to light touch all 4 extremities Coordination: Finger-to- nose-finger intact bilaterally Gait:  normal-based   IMPRESSION: 66 year old male with a history of follicular lymphoma, HTN, migraines who presents for evaluation of headaches and chin numbness over the past few weeks. MRI brain and CTA head/neck did not reveal an acute intracranial process. Discussed that migraine auras can cause numbness around the face or the mouth, but auras typically last up to 60 minutes at a time. It is much less common to have numbness from an aura persist for a week, although it can be seen in persistent aura without infarction. With history of lymphoma there is concern for numb chin syndrome from compression or infiltration of the mental nerve, which may not be apparent on MRI brain. Will treat for migraine and see if numbness improves with improvement of headache. If numbness persists, would consider MRI of the jaw or PET scan of the brain to assess for lymphoma involvement of the mental nerve.  PLAN: -Toradol shot given in office today -Rescue: Start Maxalt 10 mg PRN -If numbness persists consider MRI jaw, PET brain. If negative imaging and persistent numbness may consider LP  I spent a total of 65 minutes chart reviewing and counseling the patient. Headache education was done. Discussed medication side effects, adverse reactions and drug interactions. Written educational materials and patient instructions outlining all of the above were given.  Follow-up: 3 months or sooner if needed. Will reach out via MyChart in a week with update on headaches and numbness   Genia Harold, MD 04/11/2021   4:10 PM

## 2021-04-12 ENCOUNTER — Encounter: Payer: Self-pay | Admitting: Psychiatry

## 2021-04-13 ENCOUNTER — Other Ambulatory Visit: Payer: Self-pay | Admitting: Psychiatry

## 2021-04-13 MED ORDER — KETOROLAC TROMETHAMINE 10 MG PO TABS: 10.0000 mg | ORAL_TABLET | Freq: Three times a day (TID) | ORAL | 0 refills | Status: AC

## 2021-04-13 NOTE — Telephone Encounter (Signed)
Yes he can try a second dose of the rizatriptan. I also sent in 4 days of oral Toradol to his pharmacy. He can take this 3 times a day to try to break the headache

## 2021-04-18 ENCOUNTER — Telehealth: Payer: Self-pay | Admitting: Neurology

## 2021-04-18 ENCOUNTER — Telehealth: Payer: Self-pay | Admitting: *Deleted

## 2021-04-18 MED ORDER — HYDROCODONE-ACETAMINOPHEN 5-325 MG PO TABS: 1.0000 | ORAL_TABLET | Freq: Four times a day (QID) | ORAL | 0 refills | Status: AC | PRN

## 2021-04-18 NOTE — Telephone Encounter (Signed)
Called patient and informed him Dr Brett Fairy is work in [place] today and recommended  IV depakote, pulse of 500 mg.  If partial relief, repeat 500 mg and add 10 mg solumedrol. Infusion suite has opening at 12 noon. I called patient who stated headache is somewhat better but lip numbness is not. He has another appointment today and will be out of town tomorrow. He's using hydrocodone once a day. I advised I'll let Dr Brett Fairy know he can't come in for infusion.  Patient is open to any other recommendations from her, verbalized understanding, appreciation. ? ?

## 2021-04-18 NOTE — Telephone Encounter (Signed)
Has Mike Jenkins ever been tried on IV depakote, pulse of 500 mg ? If partial relief, repeat 500 mg and add 10 mg solumedrol?  ? ?It looks like a pulse could get him over the weekend- can you speak to intrafusion, [place]?  ? ?CD ?

## 2021-04-18 NOTE — Telephone Encounter (Signed)
Sent my chart with new Rx information. ?

## 2021-04-18 NOTE — Telephone Encounter (Signed)
I can bridge him over on hydrocodone for now.  ?

## 2021-10-01 DIAGNOSIS — H35363 Drusen (degenerative) of macula, bilateral: Secondary | ICD-10-CM | POA: Diagnosis not present

## 2021-10-01 DIAGNOSIS — H4312 Vitreous hemorrhage, left eye: Secondary | ICD-10-CM | POA: Diagnosis not present

## 2021-10-01 DIAGNOSIS — H43821 Vitreomacular adhesion, right eye: Secondary | ICD-10-CM | POA: Diagnosis not present

## 2021-10-01 DIAGNOSIS — H33002 Unspecified retinal detachment with retinal break, left eye: Secondary | ICD-10-CM | POA: Diagnosis not present

## 2021-10-01 DIAGNOSIS — H35411 Lattice degeneration of retina, right eye: Secondary | ICD-10-CM | POA: Diagnosis not present

## 2021-10-01 DIAGNOSIS — H33321 Round hole, right eye: Secondary | ICD-10-CM | POA: Diagnosis not present

## 2021-10-08 DIAGNOSIS — H35411 Lattice degeneration of retina, right eye: Secondary | ICD-10-CM | POA: Diagnosis not present

## 2021-10-08 DIAGNOSIS — H31092 Other chorioretinal scars, left eye: Secondary | ICD-10-CM | POA: Diagnosis not present

## 2021-10-08 DIAGNOSIS — H43812 Vitreous degeneration, left eye: Secondary | ICD-10-CM | POA: Diagnosis not present

## 2021-10-08 DIAGNOSIS — H4312 Vitreous hemorrhage, left eye: Secondary | ICD-10-CM | POA: Diagnosis not present

## 2021-10-25 NOTE — Telephone Encounter (Signed)
ERROR

## 2021-11-05 DIAGNOSIS — H35411 Lattice degeneration of retina, right eye: Secondary | ICD-10-CM | POA: Diagnosis not present

## 2021-11-05 DIAGNOSIS — H4312 Vitreous hemorrhage, left eye: Secondary | ICD-10-CM | POA: Diagnosis not present

## 2021-11-05 DIAGNOSIS — H31092 Other chorioretinal scars, left eye: Secondary | ICD-10-CM | POA: Diagnosis not present

## 2021-11-05 DIAGNOSIS — H43812 Vitreous degeneration, left eye: Secondary | ICD-10-CM | POA: Diagnosis not present

## 2021-12-03 DIAGNOSIS — H4312 Vitreous hemorrhage, left eye: Secondary | ICD-10-CM | POA: Diagnosis not present

## 2021-12-03 DIAGNOSIS — H43812 Vitreous degeneration, left eye: Secondary | ICD-10-CM | POA: Diagnosis not present

## 2021-12-03 DIAGNOSIS — H33332 Multiple defects of retina without detachment, left eye: Secondary | ICD-10-CM | POA: Diagnosis not present

## 2021-12-03 DIAGNOSIS — H31092 Other chorioretinal scars, left eye: Secondary | ICD-10-CM | POA: Diagnosis not present

## 2021-12-04 ENCOUNTER — Encounter: Payer: Self-pay | Admitting: Hematology

## 2021-12-06 ENCOUNTER — Encounter: Payer: Self-pay | Admitting: Internal Medicine

## 2022-01-09 DIAGNOSIS — H5203 Hypermetropia, bilateral: Secondary | ICD-10-CM | POA: Diagnosis not present

## 2022-03-01 ENCOUNTER — Encounter: Payer: Self-pay | Admitting: Hematology

## 2022-03-01 DIAGNOSIS — C8339 Diffuse large B-cell lymphoma, extranodal and solid organ sites: Secondary | ICD-10-CM | POA: Diagnosis not present

## 2022-03-01 DIAGNOSIS — E038 Other specified hypothyroidism: Secondary | ICD-10-CM | POA: Diagnosis not present

## 2022-03-01 DIAGNOSIS — E230 Hypopituitarism: Secondary | ICD-10-CM | POA: Diagnosis not present

## 2022-03-04 DIAGNOSIS — C8339 Diffuse large B-cell lymphoma, extranodal and solid organ sites: Secondary | ICD-10-CM | POA: Diagnosis not present

## 2022-03-04 DIAGNOSIS — E230 Hypopituitarism: Secondary | ICD-10-CM | POA: Diagnosis not present

## 2022-03-04 DIAGNOSIS — E038 Other specified hypothyroidism: Secondary | ICD-10-CM | POA: Diagnosis not present

## 2022-03-07 DIAGNOSIS — E038 Other specified hypothyroidism: Secondary | ICD-10-CM | POA: Diagnosis not present

## 2022-03-07 DIAGNOSIS — E230 Hypopituitarism: Secondary | ICD-10-CM | POA: Diagnosis not present

## 2022-03-07 DIAGNOSIS — C8339 Diffuse large B-cell lymphoma, extranodal and solid organ sites: Secondary | ICD-10-CM | POA: Diagnosis not present

## 2022-03-21 DIAGNOSIS — E230 Hypopituitarism: Secondary | ICD-10-CM | POA: Diagnosis not present

## 2022-03-21 DIAGNOSIS — C8339 Diffuse large B-cell lymphoma, extranodal and solid organ sites: Secondary | ICD-10-CM | POA: Diagnosis not present

## 2022-03-21 DIAGNOSIS — E038 Other specified hypothyroidism: Secondary | ICD-10-CM | POA: Diagnosis not present

## 2022-03-25 ENCOUNTER — Telehealth: Payer: Self-pay | Admitting: Hematology

## 2022-03-25 DIAGNOSIS — E038 Other specified hypothyroidism: Secondary | ICD-10-CM | POA: Diagnosis not present

## 2022-03-25 DIAGNOSIS — C8339 Diffuse large B-cell lymphoma, extranodal and solid organ sites: Secondary | ICD-10-CM | POA: Diagnosis not present

## 2022-03-25 DIAGNOSIS — E230 Hypopituitarism: Secondary | ICD-10-CM | POA: Diagnosis not present

## 2022-03-25 NOTE — Telephone Encounter (Signed)
Scheduled appt per 2/8 referral. Pt's wife is aware of appt date and time. Pt's wife is aware to arrive 15 mins prior to appt time and to bring and updated insurance card. Pt's wife is aware of appt location.

## 2022-03-28 DIAGNOSIS — E038 Other specified hypothyroidism: Secondary | ICD-10-CM | POA: Diagnosis not present

## 2022-03-28 DIAGNOSIS — C8339 Diffuse large B-cell lymphoma, extranodal and solid organ sites: Secondary | ICD-10-CM | POA: Diagnosis not present

## 2022-03-28 DIAGNOSIS — E230 Hypopituitarism: Secondary | ICD-10-CM | POA: Diagnosis not present

## 2022-04-01 DIAGNOSIS — E038 Other specified hypothyroidism: Secondary | ICD-10-CM | POA: Diagnosis not present

## 2022-04-01 DIAGNOSIS — E230 Hypopituitarism: Secondary | ICD-10-CM | POA: Diagnosis not present

## 2022-04-01 DIAGNOSIS — C8339 Diffuse large B-cell lymphoma, extranodal and solid organ sites: Secondary | ICD-10-CM | POA: Diagnosis not present

## 2022-04-11 DIAGNOSIS — E038 Other specified hypothyroidism: Secondary | ICD-10-CM | POA: Diagnosis not present

## 2022-04-11 DIAGNOSIS — E230 Hypopituitarism: Secondary | ICD-10-CM | POA: Diagnosis not present

## 2022-04-11 DIAGNOSIS — C8339 Diffuse large B-cell lymphoma, extranodal and solid organ sites: Secondary | ICD-10-CM | POA: Diagnosis not present

## 2022-04-15 ENCOUNTER — Inpatient Hospital Stay: Payer: Medicare Other | Attending: Hematology | Admitting: Hematology

## 2022-04-15 ENCOUNTER — Other Ambulatory Visit: Payer: Self-pay

## 2022-04-15 VITALS — BP 118/73 | HR 86 | Temp 98.1°F | Resp 20 | Wt 205.2 lb

## 2022-04-15 DIAGNOSIS — R200 Anesthesia of skin: Secondary | ICD-10-CM | POA: Diagnosis not present

## 2022-04-15 DIAGNOSIS — C8338 Diffuse large B-cell lymphoma, lymph nodes of multiple sites: Secondary | ICD-10-CM | POA: Diagnosis not present

## 2022-04-15 DIAGNOSIS — C8290 Follicular lymphoma, unspecified, unspecified site: Secondary | ICD-10-CM

## 2022-04-15 DIAGNOSIS — G629 Polyneuropathy, unspecified: Secondary | ICD-10-CM | POA: Diagnosis not present

## 2022-04-15 DIAGNOSIS — Z79899 Other long term (current) drug therapy: Secondary | ICD-10-CM | POA: Diagnosis not present

## 2022-04-15 DIAGNOSIS — C8339 Diffuse large B-cell lymphoma, extranodal and solid organ sites: Secondary | ICD-10-CM | POA: Diagnosis not present

## 2022-04-15 DIAGNOSIS — Z87891 Personal history of nicotine dependence: Secondary | ICD-10-CM | POA: Diagnosis not present

## 2022-04-15 DIAGNOSIS — R519 Headache, unspecified: Secondary | ICD-10-CM | POA: Diagnosis not present

## 2022-04-15 DIAGNOSIS — E038 Other specified hypothyroidism: Secondary | ICD-10-CM | POA: Diagnosis not present

## 2022-04-15 DIAGNOSIS — E230 Hypopituitarism: Secondary | ICD-10-CM | POA: Diagnosis not present

## 2022-04-15 NOTE — Progress Notes (Signed)
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 male who has been referred to [place] by Dr. Burnard Bunting or evaluation and management of Follicular lymphoma. Transformed to double Large B cell lymphoma.  Patient was previously seen by me on [id number] regarding follicular lymphoma. Patient transferred care and has been getting treated at [facility] in [place] since.   Patient has completed 5 cycles of Copansilib and Rituximab from 08/17/2021 to 12/06/2021 for symptomatic Follicular lymphoma with neck and facial pain and numbness.  He subsequently had disease progression and was noted to have transformed to double hit large B cell lymphoma and is being treated with Venetoclax plus EPOCH-R.   Patient is currently on cycle 3 of his treatment during this visit. He has been having neuropathy, headaches, and facial numbness. Patient has been getting treated at [facility] in [place]. He is going to have an PET scan after cycle 4 of his treatment for double-hit lymphoma.   He notes he had lab work done at [facility] before our visit.   Patient denies infection issues, fever, mouth sores, chills, night sweats, abdominal pain, chest pain, back pain, or leg swelling.    MEDICAL HISTORY:  Past Medical History:  Diagnosis Date   Asthma    Colon polyps    hyperplastic   Diverticulosis    Headache    Heart murmur    since he was a baby-echo done preop hip surg 2003-   Hypertension    Hypertension    Insomnia    Lymphadenopathy, inguinal    right   Lymphoma ([place])    Melanoma ([place])     SURGICAL HISTORY: Past Surgical History:  Procedure Laterality Date   COLONOSCOPY     I & D EXTREMITY  12/26/2011   Procedure: IRRIGATION AND DEBRIDEMENT  EXTREMITY;  Surgeon: Tennis Must, MD;  Location: [facility];  Service: Orthopedics;  Laterality: Left;   KNEE ARTHROSCOPY Bilateral 1995, 1998   right and left   MASS EXCISION  12/26/2011   Procedure: EXCISION MASS;  Surgeon: Tennis Must, MD;  Location: [facility];  Service: Orthopedics;  Laterality: Left;  LEFT SMALL EXCISION MASS & DEBRIDEMENT DIP JOINT   MELANOMA EXCISION Left 2013   POLYPECTOMY     tonsillar cauterization  11/09/2019   Tonsillar Cauterization (N/A Throat)   TONSILLECTOMY Right 11/04/2019   Procedure: TONSILLECTOMY;  Surgeon: Jerrell Belfast, MD;  Location: [facility];  Service: ENT;  Laterality: Right;   TONSILLECTOMY N/A 11/09/2019   Procedure: Tonsillar Cauterization;  Surgeon: Jerrell Belfast, MD;  Location: [facility];  Service: ENT;  Laterality: N/A;   TOTAL HIP ARTHROPLASTY Left 2003    SOCIAL HISTORY: Social History   Socioeconomic History   Marital status: Married    Spouse name: Not on file   Number of children: 3   Years of education: Not on file   Highest education level: Not on file  Occupational History   Occupation: land Leisure centre manager: [employer]  Tobacco Use   Smoking status: Former   Smokeless tobacco: Never  Scientific laboratory technician Use: Never used  Substance and Sexual Activity   Alcohol use: Yes    Comment: occassional   Drug use: No  Sexual activity: Not on file  Other Topics Concern   Not on file  Social History Narrative   Lives with wife   Caffeine- coffee 2 c, none in past 1 1/2 weeks   Social Determinants of Health   Financial Resource Strain: Not on file  Food Insecurity: Not on file  Transportation Needs: Not on file  Physical Activity: Not on file  Stress: Not on file  Social Connections: Not on file  Intimate Partner Violence: Not on file    FAMILY HISTORY: Family History  Problem Relation Age of Onset   Hypertension Father    Colon polyps Father     Migraines Sister    Colon cancer Neg Hx    Esophageal cancer Neg Hx    Rectal cancer Neg Hx    Stomach cancer Neg Hx     ALLERGIES:  has No Known Allergies.  MEDICATIONS:  Current Outpatient Medications  Medication Sig Dispense Refill   amoxicillin (AMOXIL) 500 MG capsule Take 500 mg by mouth 3 (three) times daily.     gabapentin (NEURONTIN) 100 MG capsule Take 100 mg by mouth 3 (three) times daily as needed.     HYDROcodone-acetaminophen (NORCO) 5-325 MG tablet Take 1 tablet by mouth every 6 (six) hours as needed for moderate pain. 10 tablet 0   HYDROcodone-acetaminophen (NORCO/VICODIN) 5-325 MG tablet Take 2 tablets by mouth every 6 (six) hours.     losartan (COZAAR) 50 MG tablet Take 50 mg by mouth daily. Taking 2 tablets daily for total of 100 mg.     methocarbamol (ROBAXIN) 500 MG tablet Take 500 mg by mouth 3 (three) times daily.     pantoprazole (PROTONIX) 40 MG tablet Take 40 mg by mouth daily.     predniSONE (STERAPRED UNI-PAK 21 TAB) 10 MG (21) TBPK tablet See admin instructions. follow package directions     rizatriptan (MAXALT) 10 MG tablet Take 1 tablet (10 mg total) by mouth as needed for migraine. May repeat in 2 hours if needed 10 tablet 2   traZODone (DESYREL) 50 MG tablet Take 50 mg by mouth at bedtime as needed.     No current facility-administered medications for this visit.    REVIEW OF SYSTEMS:    10 Point review of Systems was done is negative except as noted above.  PHYSICAL EXAMINATION: ECOG PERFORMANCE STATUS: 2 - Symptomatic, <50% confined to bed  . Vitals:   04/15/22 1440  BP: 118/73  Pulse: 86  Resp: 20  Temp: 98.1 F (36.7 C)  SpO2: 99%   Filed Weights   04/15/22 1440  Weight: 205 lb 3.2 oz (93.1 kg)   .Body mass index is 26.35 kg/m.  GENERAL:alert, in no acute distress and comfortable SKIN: no acute rashes, no significant lesions EYES: conjunctiva are pink and non-injected, sclera anicteric OROPHARYNX: MMM, no exudates, no  oropharyngeal erythema or ulceration NECK: supple, no JVD LYMPH:  no palpable lymphadenopathy in the cervical, axillary or inguinal regions LUNGS: clear to auscultation b/l with normal respiratory effort HEART: regular rate & rhythm ABDOMEN:  normoactive bowel sounds , non tender, not distended. Extremity: no pedal edema PSYCH: alert & oriented x 3 with fluent speech NEURO: no focal motor/sensory deficits  LABORATORY DATA:  I have reviewed the data as listed .    Latest Ref Rng & Units 04/01/2021    1:52 AM 11/26/2019    7:50 AM 11/17/2019    1:51 PM  CBC  WBC 4.0 - 10.5 K/uL 5.3  6.0  6.9   Hemoglobin 13.0 - 17.0 g/dL 14.0  12.1  10.7   Hematocrit 39.0 - 52.0 % 41.1  36.2  31.5   Platelets 150 - 400 K/uL 163  209  235    .    Latest Ref Rng & Units 04/01/2021    1:52 AM 11/17/2019    1:51 PM 11/09/2019   12:19 PM  CMP  Glucose 70 - 99 mg/dL 147  105  209   BUN 8 - 23 mg/dL '17  13  28   '$ Creatinine 0.61 - 1.24 mg/dL 1.23  1.04  1.20   Sodium 135 - 145 mmol/L 140  142  136   Potassium 3.5 - 5.1 mmol/L 4.0  3.9  4.2   Chloride 98 - 111 mmol/L 105  106  100   CO2 22 - 32 mmol/L 24  29    Calcium 8.9 - 10.3 mg/dL 9.3  9.7    Total Protein 6.5 - 8.1 g/dL 6.5  7.1    Total Bilirubin 0.3 - 1.2 mg/dL 0.4  0.5    Alkaline Phos 38 - 126 U/L 53  54    AST 15 - 41 U/L 19  13    ALT 0 - 44 U/L 18  17      RADIOGRAPHIC STUDIES: I have personally reviewed the radiological images as listed and agreed with the findings in the report. No results found.  ASSESSMENT & PLAN:   67 yo with   1) Stage IV Follicular lymphoma. 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  PLAN: -Patient had lab work done at [facility] before our visit. Patient showed me the lab results which showed WBC of 1.1, ANC of 700, Hemoglobin of 11.9. -Recommended to  continue following up with his oncologist in [place].  -We will be available if there is any local oncologic support is necessary. -patient likes this plan and has a comprehensive care place with his oncology team at [facility] where he prefers to continue following for his oncology cares. -outside records revoewed  FOLLOW-UP: RTC as needed  .The total time spent in the appointment was 50 minutes* .  All of the patient's questions were answered with apparent satisfaction. The patient knows to call the clinic with any problems, questions or concerns.   Sullivan Lone MD MS AAHIVMS [facility] Hematology/Oncology Physician [facility]  .*Total Encounter Time as defined by the Centers for Medicare and Medicaid Services includes, in addition to the face-to-face time of a patient visit (documented in the note above) non-face-to-face time: obtaining and reviewing outside history, ordering and reviewing medications, tests or procedures, care coordination (communications with other health care professionals or caregivers) and documentation in the medical record.  04/15/2022 11:10 AM   I, Cleda Mccreedy, am acting as a Education administrator for Sullivan Lone, MD. .I have reviewed the above documentation for accuracy and completeness, and I agree with the above. Brunetta Genera MD

## 2022-04-18 DIAGNOSIS — E230 Hypopituitarism: Secondary | ICD-10-CM | POA: Diagnosis not present

## 2022-04-18 DIAGNOSIS — C8339 Diffuse large B-cell lymphoma, extranodal and solid organ sites: Secondary | ICD-10-CM | POA: Diagnosis not present

## 2022-04-18 DIAGNOSIS — E038 Other specified hypothyroidism: Secondary | ICD-10-CM | POA: Diagnosis not present

## 2022-04-21 NOTE — Progress Notes (Incomplete)
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 male who has been referred to [place] by Dr. Burnard Bunting or evaluation and management of Follicular lymphoma. Transformed to double Large B cell lymphoma.  Patient was previously seen by me on [id number] regarding follicular lymphoma. Patient transferred care and has been getting treated at [facility] in [place] since.   Patient has completed 5 cycles of Copansilib and Rituximab from 08/17/2021 to 12/06/2021 for symptomatic Follicular lymphoma with neck and facial pain and numbness.  He subsequently had disease progression and was noted to have transformed to double hit large B cell lymphoma and is being treated with Venetoclax plus EPOCH-R.   Patient is currently on cycle 3 of his treatment during this visit. He has been having neuropathy, headaches, and facial numbness. Patient has been getting treated at [facility] in [place]. He is going to have an PET scan after cycle 4 of his treatment for double-hit lymphoma.   He notes he had lab work done at [facility] before our visit.   Patient denies infection issues, fever, mouth sores, chills, night sweats, abdominal pain, chest pain, back pain, or leg swelling.    MEDICAL HISTORY:  Past Medical History:  Diagnosis Date  . Asthma   . Colon polyps    hyperplastic  . Diverticulosis   . Headache   . Heart murmur    since he was a baby-echo done preop hip surg 2003-  . Hypertension   . Hypertension   . Insomnia   . Lymphadenopathy, inguinal    right  . Lymphoma ([place])   . Melanoma ([place])     SURGICAL HISTORY: Past Surgical History:  Procedure Laterality Date  . COLONOSCOPY    . I & D EXTREMITY  12/26/2011   Procedure: IRRIGATION  AND DEBRIDEMENT EXTREMITY;  Surgeon: Tennis Must, MD;  Location: [facility];  Service: Orthopedics;  Laterality: Left;  . KNEE ARTHROSCOPY Bilateral 1995, 1998   right and left  . MASS EXCISION  12/26/2011   Procedure: EXCISION MASS;  Surgeon: Tennis Must, MD;  Location: [facility];  Service: Orthopedics;  Laterality: Left;  LEFT SMALL EXCISION MASS & DEBRIDEMENT DIP JOINT  . MELANOMA EXCISION Left 2013  . POLYPECTOMY    . tonsillar cauterization  11/09/2019   Tonsillar Cauterization (N/A Throat)  . TONSILLECTOMY Right 11/04/2019   Procedure: TONSILLECTOMY;  Surgeon: Jerrell Belfast, MD;  Location: [facility];  Service: ENT;  Laterality: Right;  . TONSILLECTOMY N/A 11/09/2019   Procedure: Tonsillar Cauterization;  Surgeon: Jerrell Belfast, MD;  Location: [facility];  Service: ENT;  Laterality: N/A;  . TOTAL HIP ARTHROPLASTY Left 2003    SOCIAL HISTORY: Social History   Socioeconomic History  . Marital status: Married    Spouse name: Not on file  . Number of children: 3  . Years of education: Not on file  . Highest education level: Not on file  Occupational History  . Occupation: Information systems manager: [employer]  Tobacco Use  . Smoking status: Former  . Smokeless tobacco: Never  Vaping Use  . Vaping Use: Never used  Substance and Sexual Activity  . Alcohol use: Yes    Comment: occassional  . Drug use: No  .  Sexual activity: Not on file  Other Topics Concern  . Not on file  Social History Narrative   Lives with wife   Caffeine- coffee 2 c, none in past 1 1/2 weeks   Social Determinants of Health   Financial Resource Strain: Not on file  Food Insecurity: Not on file  Transportation Needs: Not on file  Physical Activity: Not on file  Stress: Not on file  Social Connections: Not on file  Intimate Partner Violence: Not on file    FAMILY HISTORY: Family History  Problem Relation Age of Onset  . Hypertension  Father   . Colon polyps Father   . Migraines Sister   . Colon cancer Neg Hx   . Esophageal cancer Neg Hx   . Rectal cancer Neg Hx   . Stomach cancer Neg Hx     ALLERGIES:  has No Known Allergies.  MEDICATIONS:  Current Outpatient Medications  Medication Sig Dispense Refill  . amoxicillin (AMOXIL) 500 MG capsule Take 500 mg by mouth 3 (three) times daily.    Marland Kitchen gabapentin (NEURONTIN) 100 MG capsule Take 100 mg by mouth 3 (three) times daily as needed.    Marland Kitchen HYDROcodone-acetaminophen (NORCO) 5-325 MG tablet Take 1 tablet by mouth every 6 (six) hours as needed for moderate pain. 10 tablet 0  . HYDROcodone-acetaminophen (NORCO/VICODIN) 5-325 MG tablet Take 2 tablets by mouth every 6 (six) hours.    Marland Kitchen losartan (COZAAR) 50 MG tablet Take 50 mg by mouth daily. Taking 2 tablets daily for total of 100 mg.    . methocarbamol (ROBAXIN) 500 MG tablet Take 500 mg by mouth 3 (three) times daily.    . pantoprazole (PROTONIX) 40 MG tablet Take 40 mg by mouth daily.    . predniSONE (STERAPRED UNI-PAK 21 TAB) 10 MG (21) TBPK tablet See admin instructions. follow package directions    . rizatriptan (MAXALT) 10 MG tablet Take 1 tablet (10 mg total) by mouth as needed for migraine. May repeat in 2 hours if needed 10 tablet 2  . traZODone (DESYREL) 50 MG tablet Take 50 mg by mouth at bedtime as needed.     No current facility-administered medications for this visit.    REVIEW OF SYSTEMS:    10 Point review of Systems was done is negative except as noted above.  PHYSICAL EXAMINATION: ECOG PERFORMANCE STATUS: 2 - Symptomatic, <50% confined to bed  . Vitals:   04/15/22 1440  BP: 118/73  Pulse: 86  Resp: 20  Temp: 98.1 F (36.7 C)  SpO2: 99%   Filed Weights   04/15/22 1440  Weight: 205 lb 3.2 oz (93.1 kg)   .Body mass index is 26.35 kg/m.  GENERAL:alert, in no acute distress and comfortable SKIN: no acute rashes, no significant lesions EYES: conjunctiva are pink and non-injected, sclera  anicteric OROPHARYNX: MMM, no exudates, no oropharyngeal erythema or ulceration NECK: supple, no JVD LYMPH:  no palpable lymphadenopathy in the cervical, axillary or inguinal regions LUNGS: clear to auscultation b/l with normal respiratory effort HEART: regular rate & rhythm ABDOMEN:  normoactive bowel sounds , non tender, not distended. Extremity: no pedal edema PSYCH: alert & oriented x 3 with fluent speech NEURO: no focal motor/sensory deficits  LABORATORY DATA:  I have reviewed the data as listed .    Latest Ref Rng & Units 04/01/2021    1:52 AM 11/26/2019    7:50 AM 11/17/2019    1:51 PM  CBC  WBC 4.0 - 10.5 K/uL 5.3  6.0  6.9   Hemoglobin 13.0 - 17.0 g/dL 14.0  12.1  10.7   Hematocrit 39.0 - 52.0 % 41.1  36.2  31.5   Platelets 150 - 400 K/uL 163  209  235    .    Latest Ref Rng & Units 04/01/2021    1:52 AM 11/17/2019    1:51 PM 11/09/2019   12:19 PM  CMP  Glucose 70 - 99 mg/dL 147  105  209   BUN 8 - 23 mg/dL '17  13  28   '$ Creatinine 0.61 - 1.24 mg/dL 1.23  1.04  1.20   Sodium 135 - 145 mmol/L 140  142  136   Potassium 3.5 - 5.1 mmol/L 4.0  3.9  4.2   Chloride 98 - 111 mmol/L 105  106  100   CO2 22 - 32 mmol/L 24  29    Calcium 8.9 - 10.3 mg/dL 9.3  9.7    Total Protein 6.5 - 8.1 g/dL 6.5  7.1    Total Bilirubin 0.3 - 1.2 mg/dL 0.4  0.5    Alkaline Phos 38 - 126 U/L 53  54    AST 15 - 41 U/L 19  13    ALT 0 - 44 U/L 18  17      RADIOGRAPHIC STUDIES: I have personally reviewed the radiological images as listed and agreed with the findings in the report. No results found.  ASSESSMENT & PLAN:   67 yo with   1) Stgage IV Follicular lymphoma. 5 cycles of Copansilib and Rituximab from 08/17/2021 to 12/06/2021  PLAN: -Patient had lab work done at [facility] before our visit. Patient showed me the lab results which showed WBC of 1.1, ANC of 700, Hemoglobin of 11.9. -Recommended to continue following up with his oncologist in [place].  -We will be available if there  is any transfusion is required.   FOLLOW-UP: RTC as needed  All of the patients questions were answered with apparent satisfaction. The patient knows to call the clinic with any problems, questions or concerns.  I spent {CHL ONC TIME VISIT - [id number] counseling the patient face to face. The total time spent in the appointment was {CHL ONC TIME VISIT - [id number] and more than 50% was on counseling and direct patient cares.    Sullivan Lone MD MS AAHIVMS [facility] Hematology/Oncology Physician [facility]  (Office):       [phone] (Work cell):  [phone] (Fax):           [phone]  04/15/2022 11:10 AM   I, Cleda Mccreedy, am acting as a Education administrator for Sullivan Lone, MD.

## 2022-04-22 DIAGNOSIS — H33321 Round hole, right eye: Secondary | ICD-10-CM | POA: Diagnosis not present

## 2022-04-22 DIAGNOSIS — H43812 Vitreous degeneration, left eye: Secondary | ICD-10-CM | POA: Diagnosis not present

## 2022-05-02 DIAGNOSIS — C8339 Diffuse large B-cell lymphoma, extranodal and solid organ sites: Secondary | ICD-10-CM | POA: Diagnosis not present

## 2022-05-02 DIAGNOSIS — E038 Other specified hypothyroidism: Secondary | ICD-10-CM | POA: Diagnosis not present

## 2022-05-02 DIAGNOSIS — E230 Hypopituitarism: Secondary | ICD-10-CM | POA: Diagnosis not present

## 2022-05-06 DIAGNOSIS — E230 Hypopituitarism: Secondary | ICD-10-CM | POA: Diagnosis not present

## 2022-05-06 DIAGNOSIS — C8339 Diffuse large B-cell lymphoma, extranodal and solid organ sites: Secondary | ICD-10-CM | POA: Diagnosis not present

## 2022-05-06 DIAGNOSIS — E039 Hypothyroidism, unspecified: Secondary | ICD-10-CM | POA: Diagnosis not present

## 2022-05-09 DIAGNOSIS — C8339 Diffuse large B-cell lymphoma, extranodal and solid organ sites: Secondary | ICD-10-CM | POA: Diagnosis not present

## 2022-05-09 DIAGNOSIS — E230 Hypopituitarism: Secondary | ICD-10-CM | POA: Diagnosis not present

## 2022-05-09 DIAGNOSIS — E038 Other specified hypothyroidism: Secondary | ICD-10-CM | POA: Diagnosis not present

## 2022-05-14 ENCOUNTER — Encounter: Payer: Self-pay | Admitting: Hematology

## 2022-05-14 DIAGNOSIS — E038 Other specified hypothyroidism: Secondary | ICD-10-CM | POA: Diagnosis not present

## 2022-05-14 DIAGNOSIS — C8339 Diffuse large B-cell lymphoma, extranodal and solid organ sites: Secondary | ICD-10-CM | POA: Diagnosis not present

## 2022-05-14 DIAGNOSIS — E230 Hypopituitarism: Secondary | ICD-10-CM | POA: Diagnosis not present

## 2022-06-05 DIAGNOSIS — K080 Exfoliation of teeth due to systemic causes: Secondary | ICD-10-CM | POA: Diagnosis not present

## 2022-06-13 DIAGNOSIS — E230 Hypopituitarism: Secondary | ICD-10-CM | POA: Diagnosis not present

## 2022-06-13 DIAGNOSIS — C8339 Diffuse large B-cell lymphoma, extranodal and solid organ sites: Secondary | ICD-10-CM | POA: Diagnosis not present

## 2022-06-13 DIAGNOSIS — E038 Other specified hypothyroidism: Secondary | ICD-10-CM | POA: Diagnosis not present

## 2022-06-25 DIAGNOSIS — D2271 Melanocytic nevi of right lower limb, including hip: Secondary | ICD-10-CM | POA: Diagnosis not present

## 2022-06-25 DIAGNOSIS — L988 Other specified disorders of the skin and subcutaneous tissue: Secondary | ICD-10-CM | POA: Diagnosis not present

## 2022-06-25 DIAGNOSIS — D485 Neoplasm of uncertain behavior of skin: Secondary | ICD-10-CM | POA: Diagnosis not present

## 2022-06-25 DIAGNOSIS — D225 Melanocytic nevi of trunk: Secondary | ICD-10-CM | POA: Diagnosis not present

## 2022-06-25 DIAGNOSIS — Z1283 Encounter for screening for malignant neoplasm of skin: Secondary | ICD-10-CM | POA: Diagnosis not present

## 2022-06-25 DIAGNOSIS — Z85820 Personal history of malignant melanoma of skin: Secondary | ICD-10-CM | POA: Diagnosis not present

## 2022-06-25 DIAGNOSIS — Z08 Encounter for follow-up examination after completed treatment for malignant neoplasm: Secondary | ICD-10-CM | POA: Diagnosis not present

## 2022-07-12 DIAGNOSIS — L988 Other specified disorders of the skin and subcutaneous tissue: Secondary | ICD-10-CM | POA: Diagnosis not present

## 2022-07-12 DIAGNOSIS — D485 Neoplasm of uncertain behavior of skin: Secondary | ICD-10-CM | POA: Diagnosis not present

## 2022-07-16 DIAGNOSIS — E785 Hyperlipidemia, unspecified: Secondary | ICD-10-CM | POA: Diagnosis not present

## 2022-07-16 DIAGNOSIS — Z125 Encounter for screening for malignant neoplasm of prostate: Secondary | ICD-10-CM | POA: Diagnosis not present

## 2022-07-16 DIAGNOSIS — R7301 Impaired fasting glucose: Secondary | ICD-10-CM | POA: Diagnosis not present

## 2022-07-16 DIAGNOSIS — R7989 Other specified abnormal findings of blood chemistry: Secondary | ICD-10-CM | POA: Diagnosis not present

## 2022-07-16 DIAGNOSIS — R82998 Other abnormal findings in urine: Secondary | ICD-10-CM | POA: Diagnosis not present

## 2022-07-16 DIAGNOSIS — I10 Essential (primary) hypertension: Secondary | ICD-10-CM | POA: Diagnosis not present

## 2022-07-17 DIAGNOSIS — C8510 Unspecified B-cell lymphoma, unspecified site: Secondary | ICD-10-CM | POA: Diagnosis not present

## 2022-07-17 DIAGNOSIS — Z0000 Encounter for general adult medical examination without abnormal findings: Secondary | ICD-10-CM | POA: Diagnosis not present

## 2022-07-25 ENCOUNTER — Ambulatory Visit: Payer: Medicare Other | Admitting: Podiatrist

## 2022-07-26 DIAGNOSIS — M79641 Pain in right hand: Secondary | ICD-10-CM | POA: Diagnosis not present

## 2022-07-26 DIAGNOSIS — M65331 Trigger finger, right middle finger: Secondary | ICD-10-CM | POA: Diagnosis not present

## 2022-08-01 ENCOUNTER — Encounter: Payer: Self-pay | Admitting: Podiatry

## 2022-08-01 ENCOUNTER — Ambulatory Visit: Payer: Medicare Other | Admitting: Podiatry

## 2022-08-01 DIAGNOSIS — B351 Tinea unguium: Secondary | ICD-10-CM | POA: Diagnosis not present

## 2022-08-01 DIAGNOSIS — M21619 Bunion of unspecified foot: Secondary | ICD-10-CM

## 2022-08-01 MED ORDER — TERBINAFINE HCL 250 MG PO TABS: 250.0000 mg | ORAL_TABLET | Freq: Every day | ORAL | 0 refills | Status: AC

## 2022-08-03 NOTE — Progress Notes (Signed)
Subjective:   Patient ID: Mike Jenkins, male   DOB: 67 y.o.   MRN: [id number]   HPI Patient states he has thick damaged nailbeds of both feet that do have a yellow discoloration and moderate discomfort stating it has been there for a fairly long time but worse over the last few months.  Does not remember any injuries does not smoke likes to be active.  Also has bunion deformity that can be symptomatic at times   Review of Systems  All other systems reviewed and are negative.       Objective:  Physical Exam Vitals and nursing note reviewed.  Constitutional:      Appearance: He is well-developed.  Pulmonary:     Effort: Pulmonary effort is normal.  Musculoskeletal:        General: Normal range of motion.  Skin:    General: Skin is warm.  Neurological:     Mental Status: He is alert.     Neurovascular status found to be intact muscle strength was found to be adequate range of motion adequate with patient found to have discolored nailbeds 1-5 both feet with thick yellow brittle type condition.  Patient had mild discomfort associated with this but more at the clinical thickness and appearance     Assessment:  Mycotic nail infection 1-5 both feet family history and also bunion deformity only moderate discomfort bilateral     Plan:  H&P reviewed and went ahead today and get a start him on oral medications he just had blood work done 2 weeks ago with normal liver function studies and so we will start him on 90 days of Lamisil which will be the only 3 months we will do for him.  I did explain there is no guarantee this will solve the problem and patient understands this.  Do not recommend treatment for currently but did educate him

## 2022-08-05 DIAGNOSIS — H59812 Chorioretinal scars after surgery for detachment, left eye: Secondary | ICD-10-CM | POA: Diagnosis not present

## 2022-08-05 DIAGNOSIS — H35411 Lattice degeneration of retina, right eye: Secondary | ICD-10-CM | POA: Diagnosis not present

## 2022-08-05 DIAGNOSIS — H43812 Vitreous degeneration, left eye: Secondary | ICD-10-CM | POA: Diagnosis not present

## 2022-08-05 DIAGNOSIS — H43821 Vitreomacular adhesion, right eye: Secondary | ICD-10-CM | POA: Diagnosis not present

## 2022-08-19 DIAGNOSIS — H33321 Round hole, right eye: Secondary | ICD-10-CM | POA: Diagnosis not present

## 2022-09-10 DIAGNOSIS — M65331 Trigger finger, right middle finger: Secondary | ICD-10-CM | POA: Diagnosis not present

## 2022-10-08 DIAGNOSIS — M65341 Trigger finger, right ring finger: Secondary | ICD-10-CM | POA: Diagnosis not present

## 2022-11-04 DIAGNOSIS — H59813 Chorioretinal scars after surgery for detachment, bilateral: Secondary | ICD-10-CM | POA: Diagnosis not present

## 2022-11-04 DIAGNOSIS — H35411 Lattice degeneration of retina, right eye: Secondary | ICD-10-CM | POA: Diagnosis not present

## 2022-11-04 DIAGNOSIS — H43821 Vitreomacular adhesion, right eye: Secondary | ICD-10-CM | POA: Diagnosis not present

## 2022-11-04 DIAGNOSIS — H43812 Vitreous degeneration, left eye: Secondary | ICD-10-CM | POA: Diagnosis not present

## 2022-12-10 DIAGNOSIS — K080 Exfoliation of teeth due to systemic causes: Secondary | ICD-10-CM | POA: Diagnosis not present

## 2022-12-17 DIAGNOSIS — G4710 Hypersomnia, unspecified: Secondary | ICD-10-CM | POA: Diagnosis not present

## 2022-12-18 DIAGNOSIS — G4710 Hypersomnia, unspecified: Secondary | ICD-10-CM | POA: Diagnosis not present

## 2022-12-25 DIAGNOSIS — L988 Other specified disorders of the skin and subcutaneous tissue: Secondary | ICD-10-CM | POA: Diagnosis not present

## 2022-12-26 DIAGNOSIS — G4733 Obstructive sleep apnea (adult) (pediatric): Secondary | ICD-10-CM | POA: Diagnosis not present

## 2023-01-13 DIAGNOSIS — I10 Essential (primary) hypertension: Secondary | ICD-10-CM | POA: Diagnosis not present

## 2023-01-13 DIAGNOSIS — G4733 Obstructive sleep apnea (adult) (pediatric): Secondary | ICD-10-CM | POA: Diagnosis not present

## 2023-02-19 DIAGNOSIS — A630 Anogenital (venereal) warts: Secondary | ICD-10-CM | POA: Diagnosis not present

## 2023-02-19 DIAGNOSIS — L648 Other androgenic alopecia: Secondary | ICD-10-CM | POA: Diagnosis not present

## 2023-03-06 DIAGNOSIS — G4733 Obstructive sleep apnea (adult) (pediatric): Secondary | ICD-10-CM | POA: Diagnosis not present

## 2023-03-07 DIAGNOSIS — G4733 Obstructive sleep apnea (adult) (pediatric): Secondary | ICD-10-CM | POA: Diagnosis not present

## 2023-03-25 DIAGNOSIS — A630 Anogenital (venereal) warts: Secondary | ICD-10-CM | POA: Diagnosis not present

## 2023-05-21 DIAGNOSIS — J40 Bronchitis, not specified as acute or chronic: Secondary | ICD-10-CM | POA: Diagnosis not present

## 2023-05-21 DIAGNOSIS — J069 Acute upper respiratory infection, unspecified: Secondary | ICD-10-CM | POA: Diagnosis not present

## 2023-05-21 DIAGNOSIS — R051 Acute cough: Secondary | ICD-10-CM | POA: Diagnosis not present

## 2023-05-21 DIAGNOSIS — R5383 Other fatigue: Secondary | ICD-10-CM | POA: Diagnosis not present

## 2023-06-17 DIAGNOSIS — K080 Exfoliation of teeth due to systemic causes: Secondary | ICD-10-CM | POA: Diagnosis not present

## 2023-06-21 DIAGNOSIS — M79605 Pain in left leg: Secondary | ICD-10-CM | POA: Diagnosis not present

## 2023-06-21 DIAGNOSIS — M25562 Pain in left knee: Secondary | ICD-10-CM | POA: Diagnosis not present

## 2023-06-21 DIAGNOSIS — M25571 Pain in right ankle and joints of right foot: Secondary | ICD-10-CM | POA: Diagnosis not present

## 2023-06-23 ENCOUNTER — Encounter (HOSPITAL_COMMUNITY)

## 2023-06-24 ENCOUNTER — Other Ambulatory Visit (HOSPITAL_COMMUNITY): Payer: Self-pay | Admitting: Medical

## 2023-06-24 ENCOUNTER — Ambulatory Visit (HOSPITAL_COMMUNITY)
Admission: RE | Admit: 2023-06-24 | Discharge: 2023-06-24 | Disposition: A | Discharge status: Home / Self Care | Source: Ambulatory Visit | Attending: Vascular Surgery | Admitting: Vascular Surgery

## 2023-06-24 DIAGNOSIS — M79605 Pain in left leg: Secondary | ICD-10-CM | POA: Diagnosis not present

## 2023-06-24 DIAGNOSIS — M7989 Other specified soft tissue disorders: Secondary | ICD-10-CM | POA: Insufficient documentation

## 2023-07-09 DIAGNOSIS — Z85820 Personal history of malignant melanoma of skin: Secondary | ICD-10-CM | POA: Diagnosis not present

## 2023-07-09 DIAGNOSIS — Z08 Encounter for follow-up examination after completed treatment for malignant neoplasm: Secondary | ICD-10-CM | POA: Diagnosis not present

## 2023-07-09 DIAGNOSIS — D225 Melanocytic nevi of trunk: Secondary | ICD-10-CM | POA: Diagnosis not present

## 2023-07-09 DIAGNOSIS — Z1283 Encounter for screening for malignant neoplasm of skin: Secondary | ICD-10-CM | POA: Diagnosis not present

## 2023-07-21 DIAGNOSIS — R7301 Impaired fasting glucose: Secondary | ICD-10-CM | POA: Diagnosis not present

## 2023-07-21 DIAGNOSIS — Z125 Encounter for screening for malignant neoplasm of prostate: Secondary | ICD-10-CM | POA: Diagnosis not present

## 2023-07-21 DIAGNOSIS — I10 Essential (primary) hypertension: Secondary | ICD-10-CM | POA: Diagnosis not present

## 2023-07-21 DIAGNOSIS — E785 Hyperlipidemia, unspecified: Secondary | ICD-10-CM | POA: Diagnosis not present

## 2023-07-28 DIAGNOSIS — Z0000 Encounter for general adult medical examination without abnormal findings: Secondary | ICD-10-CM | POA: Diagnosis not present

## 2023-07-28 DIAGNOSIS — Z125 Encounter for screening for malignant neoplasm of prostate: Secondary | ICD-10-CM | POA: Diagnosis not present

## 2023-07-28 DIAGNOSIS — I10 Essential (primary) hypertension: Secondary | ICD-10-CM | POA: Diagnosis not present

## 2023-09-30 ENCOUNTER — Other Ambulatory Visit (HOSPITAL_BASED_OUTPATIENT_CLINIC_OR_DEPARTMENT_OTHER): Payer: Self-pay | Admitting: Internal Medicine

## 2023-09-30 ENCOUNTER — Ambulatory Visit (HOSPITAL_BASED_OUTPATIENT_CLINIC_OR_DEPARTMENT_OTHER)
Admission: RE | Admit: 2023-09-30 | Discharge: 2023-09-30 | Disposition: A | Discharge status: Home / Self Care | Source: Ambulatory Visit | Attending: Internal Medicine | Admitting: Internal Medicine

## 2023-09-30 DIAGNOSIS — R0609 Other forms of dyspnea: Secondary | ICD-10-CM

## 2023-09-30 DIAGNOSIS — R0981 Nasal congestion: Secondary | ICD-10-CM

## 2023-09-30 DIAGNOSIS — C8510 Unspecified B-cell lymphoma, unspecified site: Secondary | ICD-10-CM

## 2023-09-30 DIAGNOSIS — J029 Acute pharyngitis, unspecified: Secondary | ICD-10-CM

## 2023-09-30 DIAGNOSIS — R590 Localized enlarged lymph nodes: Secondary | ICD-10-CM

## 2023-09-30 DIAGNOSIS — I10 Essential (primary) hypertension: Secondary | ICD-10-CM | POA: Diagnosis not present

## 2023-09-30 DIAGNOSIS — R1031 Right lower quadrant pain: Secondary | ICD-10-CM

## 2023-09-30 DIAGNOSIS — N400 Enlarged prostate without lower urinary tract symptoms: Secondary | ICD-10-CM | POA: Diagnosis not present

## 2023-09-30 DIAGNOSIS — K5730 Diverticulosis of large intestine without perforation or abscess without bleeding: Secondary | ICD-10-CM | POA: Diagnosis not present

## 2023-09-30 DIAGNOSIS — I6523 Occlusion and stenosis of bilateral carotid arteries: Secondary | ICD-10-CM | POA: Diagnosis not present

## 2023-09-30 DIAGNOSIS — R1032 Left lower quadrant pain: Secondary | ICD-10-CM | POA: Diagnosis not present

## 2023-09-30 DIAGNOSIS — D3501 Benign neoplasm of right adrenal gland: Secondary | ICD-10-CM | POA: Diagnosis not present

## 2023-09-30 DIAGNOSIS — M47812 Spondylosis without myelopathy or radiculopathy, cervical region: Secondary | ICD-10-CM | POA: Diagnosis not present

## 2023-09-30 DIAGNOSIS — C8590 Non-Hodgkin lymphoma, unspecified, unspecified site: Secondary | ICD-10-CM | POA: Diagnosis not present

## 2023-09-30 DIAGNOSIS — R82998 Other abnormal findings in urine: Secondary | ICD-10-CM | POA: Diagnosis not present

## 2023-09-30 MED ORDER — IOHEXOL 300 MG/ML  SOLN
100.0000 mL | Freq: Once | INTRAMUSCULAR | Status: AC | PRN
  Administered 2023-09-30: 100 mL via INTRAVENOUS

## 2023-10-29 DIAGNOSIS — H35411 Lattice degeneration of retina, right eye: Secondary | ICD-10-CM | POA: Diagnosis not present

## 2023-10-29 DIAGNOSIS — H43813 Vitreous degeneration, bilateral: Secondary | ICD-10-CM | POA: Diagnosis not present

## 2023-10-29 DIAGNOSIS — H31093 Other chorioretinal scars, bilateral: Secondary | ICD-10-CM | POA: Diagnosis not present

## 2023-10-29 DIAGNOSIS — H2513 Age-related nuclear cataract, bilateral: Secondary | ICD-10-CM | POA: Diagnosis not present

## 2023-12-23 DIAGNOSIS — K1379 Other lesions of oral mucosa: Secondary | ICD-10-CM | POA: Diagnosis not present

## 2023-12-23 DIAGNOSIS — N401 Enlarged prostate with lower urinary tract symptoms: Secondary | ICD-10-CM | POA: Diagnosis not present

## 2023-12-29 DIAGNOSIS — H35411 Lattice degeneration of retina, right eye: Secondary | ICD-10-CM | POA: Diagnosis not present

## 2023-12-29 DIAGNOSIS — H2513 Age-related nuclear cataract, bilateral: Secondary | ICD-10-CM | POA: Diagnosis not present

## 2023-12-29 DIAGNOSIS — H31093 Other chorioretinal scars, bilateral: Secondary | ICD-10-CM | POA: Diagnosis not present

## 2023-12-29 DIAGNOSIS — H43813 Vitreous degeneration, bilateral: Secondary | ICD-10-CM | POA: Diagnosis not present

## 2023-12-31 DIAGNOSIS — K080 Exfoliation of teeth due to systemic causes: Secondary | ICD-10-CM | POA: Diagnosis not present
